# Patient Record
Sex: Female | Born: 1995 | Race: White | Hispanic: No | Marital: Single | Smoking: Never smoker
Health system: Southern US, Community
[De-identification: ages and names within clinical notes are randomized; demographics above are authoritative.]

## PROBLEM LIST (undated history)

## (undated) DIAGNOSIS — F909 Attention-deficit hyperactivity disorder, unspecified type: Secondary | ICD-10-CM

## (undated) DIAGNOSIS — F419 Anxiety disorder, unspecified: Secondary | ICD-10-CM

## (undated) DIAGNOSIS — F32A Depression, unspecified: Secondary | ICD-10-CM

## (undated) HISTORY — DX: Anxiety disorder, unspecified: F41.9

## (undated) HISTORY — PX: WISDOM TOOTH EXTRACTION: SHX21

## (undated) HISTORY — DX: Depression, unspecified: F32.A

## (undated) HISTORY — DX: Attention-deficit hyperactivity disorder, unspecified type: F90.9

---
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
## 2018-04-05 DIAGNOSIS — Z803 Family history of malignant neoplasm of breast: Secondary | ICD-10-CM | POA: Insufficient documentation

## 2018-04-05 DIAGNOSIS — Z8041 Family history of malignant neoplasm of ovary: Secondary | ICD-10-CM | POA: Insufficient documentation

## 2020-07-24 LAB — HM PAP SMEAR: HM Pap smear: ABNORMAL

## 2020-09-24 ENCOUNTER — Ambulatory Visit: Payer: Self-pay | Admitting: Family Medicine

## 2020-10-01 ENCOUNTER — Other Ambulatory Visit: Payer: Self-pay

## 2020-10-01 ENCOUNTER — Ambulatory Visit: Payer: BC Managed Care – PPO | Admitting: Family Medicine

## 2020-10-01 ENCOUNTER — Encounter: Payer: Self-pay | Admitting: Family Medicine

## 2020-10-01 VITALS — BP 110/73 | HR 81 | Temp 98.4°F | Ht 62.8 in | Wt 148.8 lb

## 2020-10-01 DIAGNOSIS — Z833 Family history of diabetes mellitus: Secondary | ICD-10-CM | POA: Diagnosis not present

## 2020-10-01 DIAGNOSIS — Z0000 Encounter for general adult medical examination without abnormal findings: Secondary | ICD-10-CM

## 2020-10-01 DIAGNOSIS — F988 Other specified behavioral and emotional disorders with onset usually occurring in childhood and adolescence: Secondary | ICD-10-CM | POA: Diagnosis not present

## 2020-10-01 LAB — URINALYSIS, ROUTINE W REFLEX MICROSCOPIC
Bilirubin, UA: NEGATIVE
Glucose, UA: NEGATIVE
Ketones, UA: NEGATIVE
Leukocytes,UA: NEGATIVE
Nitrite, UA: NEGATIVE
Protein,UA: NEGATIVE
Specific Gravity, UA: 1.015 (ref 1.005–1.030)
Urobilinogen, Ur: 0.2 mg/dL (ref 0.2–1.0)
pH, UA: 6.5 (ref 5.0–7.5)

## 2020-10-01 LAB — MICROSCOPIC EXAMINATION: WBC, UA: NONE SEEN /hpf (ref 0–5)

## 2020-10-01 LAB — BAYER DCA HB A1C WAIVED: HB A1C (BAYER DCA - WAIVED): 4.8 % (ref <7.0)

## 2020-10-01 NOTE — Assessment & Plan Note (Signed)
Diagnosed as an adult by counselor. Unsure if neuropsych testing- will get records from counselor. Interested in trying medication. Will get her back when she'd like to.

## 2020-10-01 NOTE — Progress Notes (Signed)
BP 110/73   Pulse 81   Temp 98.4 F (36.9 C) (Oral)   Ht 5' 2.8" (1.595 m)   Wt 148 lb 12.8 oz (67.5 kg)   LMP 09/28/2020   SpO2 100%   BMI 26.53 kg/m    Subjective:    Patient ID: Melody Pena, female    DOB: [birth year], 25 y.o.   MRN: [id number]  HPI: Melody Pena is a 25 y.o. female presenting on 10/01/2020 for comprehensive medical examination and to establish care. Current medical complaints include:  Has a history of of anxiety and depression- has been following with counselor. Wants to have labs checked. Has a family history of diabetes so she would like to have labs checked. She had acid reflux as a baby. She had drank a lot of alcohol 1 night and had a very sore throat. She had covid in January.  Menopausal Symptoms: no  Depression Screen done today and results listed below:  Depression screen [facility] 2/9 10/01/2020  Decreased Interest 1  Down, Depressed, Hopeless 0  PHQ - 2 Score 1  Altered sleeping 0  Tired, decreased energy 1  Change in appetite 0  Feeling bad or failure about yourself  1  Trouble concentrating 1  Moving slowly or fidgety/restless 0  Suicidal thoughts 0  PHQ-9 Score 4  Difficult doing work/chores Not difficult at all    Past Medical History:  Past Medical History:  Diagnosis Date   ADHD    Anxiety    Depression     Surgical History:  Past Surgical History:  Procedure Laterality Date   WISDOM TOOTH EXTRACTION      Medications:  No current outpatient medications on file prior to visit.   No current facility-administered medications on file prior to visit.    Allergies:  No Known Allergies  Social History:  Social History   Socioeconomic History   Marital status: Single    Spouse name: Not on file   Number of children: Not on file   Years of education: Not on file   Highest education level: Not on file  Occupational History   Not on file  Tobacco Use   Smoking status: Never   Smokeless tobacco: Never  Vaping Use    Vaping Use: Never used  Substance and Sexual Activity   Alcohol use: Yes    Comment: on occasion   Drug use: Never   Sexual activity: Yes  Other Topics Concern   Not on file  Social History Narrative   Not on file   Social Determinants of Health   Financial Resource Strain: Not on file  Food Insecurity: Not on file  Transportation Needs: Not on file  Physical Activity: Not on file  Stress: Not on file  Social Connections: Not on file  Intimate Partner Violence: Not on file   Social History   Tobacco Use  Smoking Status Never  Smokeless Tobacco Never   Social History   Substance and Sexual Activity  Alcohol Use Yes   Comment: on occasion    Family History:  Family History  Problem Relation Age of Onset   Arthritis Mother    Anxiety disorder Mother    Depression Mother    Diabetes Mother    ADD / ADHD Mother    Seizures Mother    Bipolar disorder Mother    Schizophrenia Mother    Bipolar disorder Father    Hypertension Father    Diabetes Father    Breast cancer Maternal Aunt  Leukemia Maternal Aunt    Ovarian cancer Maternal Grandmother    Diabetes Maternal Grandfather    Heart attack Paternal Grandfather     Past medical history, surgical history, medications, allergies, family history and social history reviewed with patient today and changes made to appropriate areas of the chart.   Review of Systems  Constitutional: Negative.   HENT:  Positive for sore throat. Negative for congestion, ear discharge, ear pain, hearing loss, nosebleeds, sinus pain and tinnitus.   Eyes: Negative.   Respiratory: Negative.  Negative for stridor.   Cardiovascular: Negative.   Gastrointestinal:  Positive for diarrhea and heartburn. Negative for abdominal pain, blood in stool, constipation, melena, nausea and vomiting.  Genitourinary:  Positive for frequency. Negative for dysuria, flank pain, hematuria and urgency.  Musculoskeletal:  Positive for back pain. Negative for  falls, joint pain, myalgias and neck pain.  Skin: Negative.   Neurological: Negative.   Endo/Heme/Allergies:  Negative for environmental allergies and polydipsia. Bruises/bleeds easily.  Psychiatric/Behavioral:  Positive for depression. Negative for hallucinations, memory loss, substance abuse and suicidal ideas. The patient is nervous/anxious. The patient does not have insomnia.   All other ROS negative except what is listed above and in the HPI.      Objective:    BP 110/73   Pulse 81   Temp 98.4 F (36.9 C) (Oral)   Ht 5' 2.8" (1.595 m)   Wt 148 lb 12.8 oz (67.5 kg)   LMP 09/28/2020   SpO2 100%   BMI 26.53 kg/m   Wt Readings from Last 3 Encounters:  10/01/20 148 lb 12.8 oz (67.5 kg)  No results found.  Physical Exam Vitals and nursing note reviewed.  Constitutional:      General: She is not in acute distress.    Appearance: Normal appearance. She is not ill-appearing, toxic-appearing or diaphoretic.  HENT:     Head: Normocephalic and atraumatic.     Right Ear: Tympanic membrane, ear canal and external ear normal. There is no impacted cerumen.     Left Ear: Tympanic membrane, ear canal and external ear normal. There is no impacted cerumen.     Nose: Nose normal. No congestion or rhinorrhea.     Mouth/Throat:     Mouth: Mucous membranes are moist.     Pharynx: Oropharynx is clear. No oropharyngeal exudate or posterior oropharyngeal erythema.  Eyes:     General: No scleral icterus.       Right eye: No discharge.        Left eye: No discharge.     Extraocular Movements: Extraocular movements intact.     Conjunctiva/sclera: Conjunctivae normal.     Pupils: Pupils are equal, round, and reactive to light.  Neck:     Vascular: No carotid bruit.  Cardiovascular:     Rate and Rhythm: Normal rate and regular rhythm.     Pulses: Normal pulses.     Heart sounds: No murmur heard.   No friction rub. No gallop.  Pulmonary:     Effort: Pulmonary effort is normal. No respiratory  distress.     Breath sounds: Normal breath sounds. No stridor. No wheezing, rhonchi or rales.  Chest:     Chest wall: No tenderness.  Abdominal:     General: Abdomen is flat. Bowel sounds are normal. There is no distension.     Palpations: Abdomen is soft. There is no mass.     Tenderness: There is no abdominal tenderness. There is no right CVA tenderness, left  CVA tenderness, guarding or rebound.     Hernia: No hernia is present.  Genitourinary:    Comments: Breast and pelvic exams deferred with shared decision making Musculoskeletal:        General: No swelling, tenderness, deformity or signs of injury.     Cervical back: Normal range of motion and neck supple. No rigidity. No muscular tenderness.     Right lower leg: No edema.     Left lower leg: No edema.  Lymphadenopathy:     Cervical: No cervical adenopathy.  Skin:    General: Skin is warm and dry.     Capillary Refill: Capillary refill takes less than 2 seconds.     Coloration: Skin is not jaundiced or pale.     Findings: No bruising, erythema, lesion or rash.  Neurological:     General: No focal deficit present.     Mental Status: She is alert and oriented to person, place, and time. Mental status is at baseline.     Cranial Nerves: No cranial nerve deficit.     Sensory: No sensory deficit.     Motor: No weakness.     Coordination: Coordination normal.     Gait: Gait normal.     Deep Tendon Reflexes: Reflexes normal.  Psychiatric:        Mood and Affect: Mood normal.        Behavior: Behavior normal.        Thought Content: Thought content normal.        Judgment: Judgment normal.    No results found for this or any previous visit.    Assessment & Plan:   Problem List Items Addressed This Visit   None    Follow up plan: No follow-ups on file.   LABORATORY TESTING:  - Pap smear: done elsewhere  IMMUNIZATIONS:   - Tdap: Tetanus vaccination status reviewed: unsure. - Influenza: Postponed to flu season -  Pneumovax: Not applicable - Prevnar: Not applicable - HPV: unsure - COVID vaccine: Up to date  PATIENT COUNSELING:   Advised to take 1 mg of folate supplement per day if capable of pregnancy.   Sexuality: Discussed sexually transmitted diseases, partner selection, use of condoms, avoidance of unintended pregnancy  and contraceptive alternatives.   Advised to avoid cigarette smoking.  I discussed with the patient that most people either abstain from alcohol or drink within safe limits (<=14/week and <=4 drinks/occasion for males, <=7/weeks and <= 3 drinks/occasion for females) and that the risk for alcohol disorders and other health effects rises proportionally with the number of drinks per week and how often a drinker exceeds daily limits.  Discussed cessation/primary prevention of drug use and availability of treatment for abuse.   Diet: Encouraged to adjust caloric intake to maintain  or achieve ideal body weight, to reduce intake of dietary saturated fat and total fat, to limit sodium intake by avoiding high sodium foods and not adding table salt, and to maintain adequate dietary potassium and calcium preferably from fresh fruits, vegetables, and low-fat dairy products.    stressed the importance of regular exercise  Injury prevention: Discussed safety belts, safety helmets, smoke detector, smoking near bedding or upholstery.   Dental health: Discussed importance of regular tooth brushing, flossing, and dental visits.    NEXT PREVENTATIVE PHYSICAL DUE IN 1 YEAR. No follow-ups on file.

## 2020-10-02 LAB — CBC WITH DIFFERENTIAL/PLATELET
Basophils Absolute: 0 10*3/uL (ref 0.0–0.2)
Basos: 1 %
EOS (ABSOLUTE): 0.1 10*3/uL (ref 0.0–0.4)
Eos: 1 %
Hematocrit: 42.9 % (ref 34.0–46.6)
Hemoglobin: 14 g/dL (ref 11.1–15.9)
Immature Grans (Abs): 0 10*3/uL (ref 0.0–0.1)
Immature Granulocytes: 0 %
Lymphocytes Absolute: 1.7 10*3/uL (ref 0.7–3.1)
Lymphs: 26 %
MCH: 30.2 pg (ref 26.6–33.0)
MCHC: 32.6 g/dL (ref 31.5–35.7)
MCV: 93 fL (ref 79–97)
Monocytes Absolute: 0.6 10*3/uL (ref 0.1–0.9)
Monocytes: 10 %
Neutrophils Absolute: 4 10*3/uL (ref 1.4–7.0)
Neutrophils: 62 %
Platelets: 199 10*3/uL (ref 150–450)
RBC: 4.64 x10E6/uL (ref 3.77–5.28)
RDW: 11.8 % (ref 11.7–15.4)
WBC: 6.4 10*3/uL (ref 3.4–10.8)

## 2020-10-02 LAB — COMPREHENSIVE METABOLIC PANEL
ALT: 21 IU/L (ref 0–32)
AST: 16 IU/L (ref 0–40)
Albumin/Globulin Ratio: 1.9 (ref 1.2–2.2)
Albumin: 4.6 g/dL (ref 3.9–5.0)
Alkaline Phosphatase: 60 IU/L (ref 44–121)
BUN/Creatinine Ratio: 10 (ref 9–23)
BUN: 8 mg/dL (ref 6–20)
Bilirubin Total: 0.4 mg/dL (ref 0.0–1.2)
CO2: 24 mmol/L (ref 20–29)
Calcium: 9.3 mg/dL (ref 8.7–10.2)
Chloride: 101 mmol/L (ref 96–106)
Creatinine, Ser: 0.77 mg/dL (ref 0.57–1.00)
Globulin, Total: 2.4 g/dL (ref 1.5–4.5)
Glucose: 81 mg/dL (ref 65–99)
Potassium: 4.3 mmol/L (ref 3.5–5.2)
Sodium: 139 mmol/L (ref 134–144)
Total Protein: 7 g/dL (ref 6.0–8.5)
eGFR: 110 mL/min/{1.73_m2} (ref >59)

## 2020-10-02 LAB — LIPID PANEL W/O CHOL/HDL RATIO
Cholesterol, Total: 137 mg/dL (ref 100–199)
HDL: 45 mg/dL (ref >39)
LDL Chol Calc (NIH): 71 mg/dL (ref 0–99)
Triglycerides: 118 mg/dL (ref 0–149)
VLDL Cholesterol Cal: 21 mg/dL (ref 5–40)

## 2020-10-02 LAB — VITAMIN D 25 HYDROXY (VIT D DEFICIENCY, FRACTURES): Vit D, 25-Hydroxy: 31.9 ng/mL (ref 30.0–100.0)

## 2020-10-02 LAB — TSH: TSH: 1.23 u[IU]/mL (ref 0.450–4.500)

## 2020-11-06 ENCOUNTER — Ambulatory Visit: Payer: BC Managed Care – PPO | Admitting: Family Medicine

## 2020-11-29 ENCOUNTER — Ambulatory Visit
Admission: RE | Admit: 2020-11-29 | Discharge: 2020-11-29 | Disposition: A | Discharge status: Home / Self Care | Payer: BC Managed Care – PPO | Source: Ambulatory Visit | Attending: Internal Medicine | Admitting: Internal Medicine

## 2020-11-29 ENCOUNTER — Other Ambulatory Visit: Payer: Self-pay

## 2020-11-29 VITALS — BP 105/66 | HR 78 | Temp 97.9°F | Resp 16

## 2020-11-29 DIAGNOSIS — Z20822 Contact with and (suspected) exposure to covid-19: Secondary | ICD-10-CM | POA: Diagnosis not present

## 2020-11-29 DIAGNOSIS — J029 Acute pharyngitis, unspecified: Secondary | ICD-10-CM | POA: Diagnosis present

## 2020-11-29 LAB — POCT RAPID STREP A (OFFICE): Rapid Strep A Screen: NEGATIVE

## 2020-11-29 MED ORDER — AMOXICILLIN 875 MG PO TABS: 875.0000 mg | ORAL_TABLET | Freq: Two times a day (BID) | ORAL | 0 refills | Status: AC

## 2020-11-29 NOTE — Discharge Instructions (Addendum)
Your rapid strep test is negative.  Throat culture and COVID-19 viral swab are pending.  You have been prescribed amoxicillin antibiotic due to suspicion of strep throat.  We will call if anything is positive.

## 2020-11-29 NOTE — ED Triage Notes (Signed)
Starting on 11/27/20, sore throat, 100.5 fever Friday morning, took fever medications, also took Ibuprofen, benadryl, temp was 97. something early Friday morning, fever broke, But at 12 pm Friday, chills came back, but went away about 4 pm Friday. Yesterday throat got worse. Today took Sudafed, drank tea, feels better now. Coughed up greenish mucus  this morning. Presently no fever and no cough but the throat is still hurting

## 2020-11-29 NOTE — ED Provider Notes (Signed)
[facility]    CSN: [id number] Arrival date & time: 11/29/20  1140      History   Chief Complaint Chief Complaint  Patient presents with   Sore Throat    HPI Melody Pena is a 25 y.o. female.   Patient presents with sore throat, fever, nasal congestion that has been present for approximately 2 to 3 days.  T-max at home was 100.5.  Has taken ibuprofen, Benadryl, over-the-counter decongestant with minimal improvement in symptoms.  Denies any known sick contacts.  Denies any chest pain or shortness of breath.   Sore Throat   Past Medical History:  Diagnosis Date   ADHD    Anxiety    Depression     Patient Active Problem List   Diagnosis Date Noted   Attention deficit disorder 10/01/2020   Family history of ovarian cancer 04/05/2018   Family history of breast cancer 04/05/2018    Past Surgical History:  Procedure Laterality Date   WISDOM TOOTH EXTRACTION      OB History   No obstetric history on file.      Home Medications    Prior to Admission medications   Medication Sig Start Date End Date Taking? Authorizing Provider  amoxicillin (AMOXIL) 875 MG tablet Take 1 tablet (875 mg total) by mouth 2 (two) times daily for 10 days. 11/29/20 12/09/20 Yes Lance Muss, FNP    Family History Family History  Problem Relation Age of Onset   Arthritis Mother    Anxiety disorder Mother    Depression Mother    Diabetes Mother    ADD / ADHD Mother    Seizures Mother    Bipolar disorder Mother    Schizophrenia Mother    Bipolar disorder Father    Hypertension Father    Diabetes Father    Breast cancer Maternal Aunt    Leukemia Maternal Aunt    Ovarian cancer Maternal Grandmother    Diabetes Maternal Grandfather    Heart attack Paternal Grandfather     Social History Social History   Tobacco Use   Smoking status: Never   Smokeless tobacco: Never  Vaping Use   Vaping Use: Never used  Substance Use Topics   Alcohol use: Yes     Comment: on occasion   Drug use: Never     Allergies   Patient has no known allergies.   Review of Systems Review of Systems Per HPI  Physical Exam Triage Vital Signs ED Triage Vitals  Enc Vitals Group     BP 11/29/20 1218 105/66     Pulse Rate 11/29/20 1218 78     Resp 11/29/20 1218 16     Temp 11/29/20 1218 97.9 F (36.6 C)     Temp Source 11/29/20 1218 Oral     SpO2 11/29/20 1218 98 %     Weight --      Height --      Head Circumference --      Peak Flow --      Pain Score 11/29/20 1220 4     Pain Loc --      Pain Edu? --      Excl. in GC? --    No data found.  Updated Vital Signs BP 105/66 (BP Location: Left Arm)   Pulse 78   Temp 97.9 F (36.6 C) (Oral)   Resp 16   LMP 11/28/2020   SpO2 98%   Visual Acuity Right Eye Distance:   Left Eye Distance:  Bilateral Distance:    Right Eye Near:   Left Eye Near:    Bilateral Near:     Physical Exam Constitutional:      General: She is not in acute distress.    Appearance: Normal appearance.  HENT:     Head: Normocephalic and atraumatic.     Right Ear: Tympanic membrane and ear canal normal.     Left Ear: Tympanic membrane and ear canal normal.     Nose: Congestion present.     Mouth/Throat:     Mouth: Mucous membranes are moist.     Pharynx: Posterior oropharyngeal erythema present.     Comments: White patches present to posterior pharynx. Eyes:     Extraocular Movements: Extraocular movements intact.     Conjunctiva/sclera: Conjunctivae normal.     Pupils: Pupils are equal, round, and reactive to light.  Cardiovascular:     Rate and Rhythm: Normal rate and regular rhythm.     Pulses: Normal pulses.     Heart sounds: Normal heart sounds.  Pulmonary:     Effort: Pulmonary effort is normal. No respiratory distress.     Breath sounds: Normal breath sounds. No wheezing.  Abdominal:     General: Abdomen is flat. Bowel sounds are normal.     Palpations: Abdomen is soft.  Musculoskeletal:         General: Normal range of motion.     Cervical back: Normal range of motion.  Skin:    General: Skin is warm and dry.  Neurological:     General: No focal deficit present.     Mental Status: She is alert and oriented to person, place, and time. Mental status is at baseline.  Psychiatric:        Mood and Affect: Mood normal.        Behavior: Behavior normal.     UC Treatments / Results  Labs (all labs ordered are listed, but only abnormal results are displayed) Labs Reviewed  CULTURE, GROUP A STREP ([facility])  NOVEL CORONAVIRUS, NAA  POCT RAPID STREP A (OFFICE)    EKG   Radiology No results found.  Procedures Procedures (including critical care time)  Medications Ordered in UC Medications - No data to display  Initial Impression / Assessment and Plan / UC Course  I have reviewed the triage vital signs and the nursing notes.  Pertinent labs & imaging results that were available during my care of the patient were reviewed by me and considered in my medical decision making (see chart for details).     Rapid strep test was negative but high suspicion for strep throat due to appearance of posterior pharynx on exam.  Will treat with amoxicillin x10 days.  Throat culture and COVID-19 viral swab are pending.  Fever monitoring and management discussed with patient.  Discussed strict return precautions. Patient verbalized understanding and is agreeable with plan.   Final Clinical Impressions(s) / UC Diagnoses   Final diagnoses:  Sore throat  Encounter for laboratory testing for COVID-19 virus     Discharge Instructions      Your rapid strep test is negative.  Throat culture and COVID-19 viral swab are pending.  You have been prescribed amoxicillin antibiotic due to suspicion of strep throat.  We will call if anything is positive.     ED Prescriptions     Medication Sig Dispense Auth. Provider   amoxicillin (AMOXIL) 875 MG tablet Take 1 tablet (875 mg total) by mouth 2  (two) times  daily for 10 days. 20 tablet Lance Muss, FNP      PDMP not reviewed this encounter.   Lance Muss, FNP 11/29/20 [phone]

## 2020-12-01 LAB — CULTURE, GROUP A STREP (THRC)

## 2020-12-01 LAB — NOVEL CORONAVIRUS, NAA: SARS-CoV-2, NAA: NOT DETECTED

## 2020-12-01 LAB — SARS-COV-2, NAA 2 DAY TAT

## 2020-12-15 ENCOUNTER — Encounter: Payer: Self-pay | Admitting: Nurse Practitioner

## 2020-12-15 ENCOUNTER — Ambulatory Visit: Payer: BC Managed Care – PPO | Admitting: Nurse Practitioner

## 2020-12-15 ENCOUNTER — Other Ambulatory Visit: Payer: Self-pay

## 2020-12-15 VITALS — BP 114/77 | HR 94 | Temp 98.6°F | Ht 62.0 in | Wt 145.0 lb

## 2020-12-15 DIAGNOSIS — J019 Acute sinusitis, unspecified: Secondary | ICD-10-CM

## 2020-12-15 MED ORDER — DOXYCYCLINE HYCLATE 100 MG PO TABS: 100.0000 mg | ORAL_TABLET | Freq: Two times a day (BID) | ORAL | 0 refills | Status: DC

## 2020-12-15 MED ORDER — PREDNISONE 10 MG PO TABS: ORAL_TABLET | ORAL | 0 refills | Status: DC

## 2020-12-15 NOTE — Patient Instructions (Signed)
Start flonase (fluticasone) daily, you can get this at any pharmacy Start antibiotic and prednisone sent to your pharmacy Follow up if your not any better after completing the course of medications.

## 2020-12-15 NOTE — Progress Notes (Signed)
Acute Office Visit  Subjective:    Patient ID: Melody Pena, female    DOB: Aug 07, 1995, 25 y.o.   MRN: [id number]  Chief Complaint  Patient presents with   URI    HPI Patient is in today for URI symptoms. September 22 had 102.5 fever, and sore throat. Next day, endorsed chills and sore throat. Went to urgent care on 11/30/20 and was negative for strep and covid-19. Treated for strep with amoxicillin, as she had white spots on her tonsils. Her symptoms had improved some, however her sore throat and sinus pressure have worsened again since finishing the antibiotic.   UPPER RESPIRATORY TRACT INFECTION  Worst symptom: sinus pressure Fever: no Cough: yes Shortness of breath: no Wheezing: no Chest pain: no Chest tightness: no Chest congestion: yes Nasal congestion: yes Runny nose: yes Post nasal drip: yes Sneezing: yes Sore throat: yes Swollen glands: no Sinus pressure: yes Headache: yes Face pain: no Toothache: no Ear pain: no  Ear pressure: no  Eyes red/itching:no Eye drainage/crusting: no  Vomiting: no Rash: no Fatigue: yes Sick contacts: no Strep contacts: no  Context: fluctuating Recurrent sinusitis: no Relief with OTC cold/cough medications: yes  Treatments attempted: cold/sinus and anti-histamine, ibuprofen, cough drop, amoxicillin    Past Medical History:  Diagnosis Date   ADHD    Anxiety    Depression     Past Surgical History:  Procedure Laterality Date   WISDOM TOOTH EXTRACTION      Family History  Problem Relation Age of Onset   Arthritis Mother    Anxiety disorder Mother    Depression Mother    Diabetes Mother    ADD / ADHD Mother    Seizures Mother    Bipolar disorder Mother    Schizophrenia Mother    Bipolar disorder Father    Hypertension Father    Diabetes Father    Breast cancer Maternal Aunt    Leukemia Maternal Aunt    Ovarian cancer Maternal Grandmother    Diabetes Maternal Grandfather    Heart attack Paternal  Grandfather     Social History   Socioeconomic History   Marital status: Single    Spouse name: Not on file   Number of children: Not on file   Years of education: Not on file   Highest education level: Not on file  Occupational History   Not on file  Tobacco Use   Smoking status: Never   Smokeless tobacco: Never  Vaping Use   Vaping Use: Never used  Substance and Sexual Activity   Alcohol use: Yes    Comment: on occasion   Drug use: Never   Sexual activity: Yes  Other Topics Concern   Not on file  Social History Narrative   Not on file   Social Determinants of Health   Financial Resource Strain: Not on file  Food Insecurity: Not on file  Transportation Needs: Not on file  Physical Activity: Not on file  Stress: Not on file  Social Connections: Not on file  Intimate Partner Violence: Not on file    Outpatient Medications Prior to Visit  Medication Sig Dispense Refill   pseudoephedrine-acetaminophen (TYLENOL SINUS) 30-500 MG TABS tablet Take 1 tablet by mouth every 4 (four) hours as needed.     No facility-administered medications prior to visit.    No Known Allergies  Review of Systems  Constitutional:  Positive for fatigue. Negative for fever.  HENT:  Positive for congestion, postnasal drip, rhinorrhea, sinus pressure and sore  throat. Negative for ear pain.   Eyes: Negative.   Respiratory:  Positive for cough. Negative for shortness of breath.   Cardiovascular: Negative.   Gastrointestinal: Negative.   Genitourinary: Negative.   Musculoskeletal: Negative.   Skin: Negative.   Neurological:  Positive for headaches. Negative for dizziness.      Objective:    Physical Exam Vitals and nursing note reviewed.  Constitutional:      General: She is not in acute distress.    Appearance: Normal appearance.  HENT:     Head: Normocephalic.     Right Ear: Tympanic membrane, ear canal and external ear normal.     Left Ear: Tympanic membrane, ear canal and  external ear normal.     Nose: Nose normal.     Mouth/Throat:     Mouth: Mucous membranes are moist.     Pharynx: Posterior oropharyngeal erythema present. No oropharyngeal exudate.  Eyes:     Conjunctiva/sclera: Conjunctivae normal.  Cardiovascular:     Rate and Rhythm: Normal rate and regular rhythm.     Pulses: Normal pulses.     Heart sounds: Normal heart sounds.  Pulmonary:     Effort: Pulmonary effort is normal.     Breath sounds: Normal breath sounds.  Abdominal:     Palpations: Abdomen is soft.     Tenderness: There is no abdominal tenderness.  Musculoskeletal:     Cervical back: Normal range of motion and neck supple. Tenderness present.  Lymphadenopathy:     Cervical: No cervical adenopathy.  Skin:    General: Skin is warm.  Neurological:     General: No focal deficit present.     Mental Status: She is alert and oriented to person, place, and time.  Psychiatric:        Mood and Affect: Mood normal.        Behavior: Behavior normal.        Thought Content: Thought content normal.        Judgment: Judgment normal.    BP 114/77   Pulse 94   Temp 98.6 F (37 C)   Ht 5' 2"  (1.575 m)   Wt 145 lb (65.8 kg)   LMP 11/28/2020   SpO2 99%   BMI 26.52 kg/m  Wt Readings from Last 3 Encounters:  12/15/20 145 lb (65.8 kg)  10/01/20 148 lb 12.8 oz (67.5 kg)    Health Maintenance Due  Topic Date Due   HPV VACCINES (2 - 3-dose series) 02/08/2012   PAP-Cervical Cytology Screening  Never done   PAP SMEAR-Modifier  Never done   TETANUS/TDAP  10/29/2017   INFLUENZA VACCINE  Never done       Topic Date Due   HPV VACCINES (2 - 3-dose series) 02/08/2012     Lab Results  Component Value Date   TSH 1.230 10/01/2020   Lab Results  Component Value Date   WBC 6.4 10/01/2020   HGB 14.0 10/01/2020   HCT 42.9 10/01/2020   MCV 93 10/01/2020   PLT 199 10/01/2020   Lab Results  Component Value Date   NA 139 10/01/2020   K 4.3 10/01/2020   CO2 24 10/01/2020    GLUCOSE 81 10/01/2020   BUN 8 10/01/2020   CREATININE 0.77 10/01/2020   BILITOT 0.4 10/01/2020   ALKPHOS 60 10/01/2020   AST 16 10/01/2020   ALT 21 10/01/2020   PROT 7.0 10/01/2020   ALBUMIN 4.6 10/01/2020   CALCIUM 9.3 10/01/2020   EGFR 110 10/01/2020  Lab Results  Component Value Date   CHOL 137 10/01/2020   Lab Results  Component Value Date   HDL 45 10/01/2020   Lab Results  Component Value Date   LDLCALC 71 10/01/2020   Lab Results  Component Value Date   TRIG 118 10/01/2020   No results found for: [facility] Lab Results  Component Value Date   HGBA1C 4.8 10/01/2020       Assessment & Plan:   Problem List Items Addressed This Visit   None Visit Diagnoses     Acute non-recurrent sinusitis, unspecified location    -  Primary   Will treat with prednisone taper and doxycycline. Can use OTC flonase, continue nasal saline spray, and cold/sinus med. F/U if no improvement or worsen.   Relevant Medications   pseudoephedrine-acetaminophen (TYLENOL SINUS) 30-500 MG TABS tablet   doxycycline (VIBRA-TABS) 100 MG tablet   predniSONE (DELTASONE) 10 MG tablet        Meds ordered this encounter  Medications   doxycycline (VIBRA-TABS) 100 MG tablet    Sig: Take 1 tablet (100 mg total) by mouth 2 (two) times daily.    Dispense:  20 tablet    Refill:  0   predniSONE (DELTASONE) 10 MG tablet    Sig: Take 6 tablets today, then 5 tablets tomorrow, then decrease by 1 tablet every day until gone    Dispense:  21 tablet    Refill:  0      Charyl Dancer, NP

## 2020-12-25 ENCOUNTER — Encounter: Payer: Self-pay | Admitting: Nurse Practitioner

## 2020-12-25 ENCOUNTER — Other Ambulatory Visit: Payer: Self-pay

## 2020-12-25 ENCOUNTER — Ambulatory Visit (INDEPENDENT_AMBULATORY_CARE_PROVIDER_SITE_OTHER): Payer: BC Managed Care – PPO | Admitting: Nurse Practitioner

## 2020-12-25 ENCOUNTER — Ambulatory Visit
Admission: RE | Admit: 2020-12-25 | Discharge: 2020-12-25 | Disposition: A | Discharge status: Home / Self Care | Payer: BC Managed Care – PPO | Attending: Nurse Practitioner | Admitting: Nurse Practitioner

## 2020-12-25 ENCOUNTER — Ambulatory Visit
Admission: RE | Admit: 2020-12-25 | Discharge: 2020-12-25 | Disposition: A | Discharge status: Home / Self Care | Payer: BC Managed Care – PPO | Source: Ambulatory Visit | Attending: Nurse Practitioner | Admitting: Nurse Practitioner

## 2020-12-25 VITALS — BP 107/65 | HR 96 | Temp 98.2°F | Ht 63.0 in | Wt 143.1 lb

## 2020-12-25 DIAGNOSIS — R051 Acute cough: Secondary | ICD-10-CM

## 2020-12-25 MED ORDER — BENZONATATE 100 MG PO CAPS: 100.0000 mg | ORAL_CAPSULE | Freq: Three times a day (TID) | ORAL | 0 refills | Status: DC | PRN

## 2020-12-25 NOTE — Patient Instructions (Addendum)
[facility] [address] [place], [place] [address] [phone]   Start claritin (loratadine) or zyrtec (cetirizine) daily Continue flonase daily Make sure you are drinking plenty of fluids You can take tessalon perles 1-2 capsules as needed (up to 3 times a day)

## 2020-12-25 NOTE — Progress Notes (Signed)
Acute Office Visit  Subjective:    Patient ID: Melody Pena, female    DOB: 1995-07-12, 25 y.o.   MRN: [id number]  Chief Complaint  Patient presents with   Cough    Still congested. Productive cough with yellow mucous. 3 weeks for symptoms. Did a rapid and PCR covid test 3 weeks ago- Negative.     HPI Patient is in today for congestion and productive cough. She was seen in urgent care on 11/29/20 and had a negative covid-19 and strep, she was treated with amoxicillin. She had started to feel better, then got worse again. She was seen on 12/15/20 and treated with prednisone and doxycycline. Her symptoms improved with the antibiotic and prednisone, however worsened again.   UPPER RESPIRATORY TRACT INFECTION  Worst symptom: cough, mucus Fever: no Cough: yes Shortness of breath: no Wheezing: no Chest pain: no Chest tightness: no Chest congestion: no Nasal congestion: no Runny nose: no Post nasal drip: yes Sneezing: no Sore throat: yes - dry Swollen glands: yes Sinus pressure: no Headache: no Face pain: no Toothache: no Ear pain: no  Ear pressure: no  Eyes red/itching:no Eye drainage/crusting: no  Vomiting: no Rash: no Fatigue: yes Sick contacts: no Strep contacts: no  Context: fluctuating Recurrent sinusitis: no Relief with OTC cold/cough medications: yes  Treatments attempted:  tylenol severe sinus, flonase     Past Medical History:  Diagnosis Date   ADHD    Anxiety    Depression     Past Surgical History:  Procedure Laterality Date   WISDOM TOOTH EXTRACTION      Family History  Problem Relation Age of Onset   Arthritis Mother    Anxiety disorder Mother    Depression Mother    Diabetes Mother    ADD / ADHD Mother    Seizures Mother    Bipolar disorder Mother    Schizophrenia Mother    Bipolar disorder Father    Hypertension Father    Diabetes Father    Breast cancer Maternal Aunt    Leukemia Maternal Aunt    Ovarian cancer Maternal  Grandmother    Diabetes Maternal Grandfather    Heart attack Paternal Grandfather     Social History   Socioeconomic History   Marital status: Single    Spouse name: Not on file   Number of children: Not on file   Years of education: Not on file   Highest education level: Not on file  Occupational History   Not on file  Tobacco Use   Smoking status: Never   Smokeless tobacco: Never  Vaping Use   Vaping Use: Never used  Substance and Sexual Activity   Alcohol use: Yes    Comment: on occasion   Drug use: Never   Sexual activity: Yes  Other Topics Concern   Not on file  Social History Narrative   Not on file   Social Determinants of Health   Financial Resource Strain: Not on file  Food Insecurity: Not on file  Transportation Needs: Not on file  Physical Activity: Not on file  Stress: Not on file  Social Connections: Not on file  Intimate Partner Violence: Not on file    Outpatient Medications Prior to Visit  Medication Sig Dispense Refill   pseudoephedrine-acetaminophen (TYLENOL SINUS) 30-500 MG TABS tablet Take 1 tablet by mouth every 4 (four) hours as needed.     doxycycline (VIBRA-TABS) 100 MG tablet Take 1 tablet (100 mg total) by mouth 2 (two) times daily.  20 tablet 0   predniSONE (DELTASONE) 10 MG tablet Take 6 tablets today, then 5 tablets tomorrow, then decrease by 1 tablet every day until gone 21 tablet 0   No facility-administered medications prior to visit.    No Known Allergies  Review of Systems  Constitutional:  Positive for fatigue. Negative for fever.  HENT:  Positive for postnasal drip and sore throat. Negative for congestion, ear pain, rhinorrhea, sinus pressure and sinus pain.   Eyes: Negative.   Respiratory:  Positive for cough. Negative for shortness of breath.   Cardiovascular: Negative.   Gastrointestinal: Negative.   Genitourinary: Negative.   Musculoskeletal: Negative.   Skin: Negative.   Neurological: Negative.       Objective:     Physical Exam Vitals and nursing note reviewed.  Constitutional:      General: She is not in acute distress.    Appearance: Normal appearance.  HENT:     Head: Normocephalic.     Right Ear: Tympanic membrane, ear canal and external ear normal.     Left Ear: Tympanic membrane, ear canal and external ear normal.     Mouth/Throat:     Pharynx: Posterior oropharyngeal erythema present. No oropharyngeal exudate.  Eyes:     Conjunctiva/sclera: Conjunctivae normal.  Cardiovascular:     Rate and Rhythm: Normal rate and regular rhythm.     Pulses: Normal pulses.     Heart sounds: Normal heart sounds.  Pulmonary:     Effort: Pulmonary effort is normal.     Breath sounds: Normal breath sounds.  Musculoskeletal:     Cervical back: Normal range of motion.  Skin:    General: Skin is warm.  Neurological:     General: No focal deficit present.     Mental Status: She is alert and oriented to person, place, and time.  Psychiatric:        Mood and Affect: Mood normal.        Behavior: Behavior normal.        Thought Content: Thought content normal.        Judgment: Judgment normal.    BP 107/65   Pulse 96   Temp 98.2 F (36.8 C) (Oral)   Ht 5' 3"  (1.6 m)   Wt 143 lb 0.8 oz (64.9 kg)   LMP 11/28/2020   SpO2 100%   BMI 25.34 kg/m  Wt Readings from Last 3 Encounters:  12/25/20 143 lb 0.8 oz (64.9 kg)  12/15/20 145 lb (65.8 kg)  10/01/20 148 lb 12.8 oz (67.5 kg)    Health Maintenance Due  Topic Date Due   HPV VACCINES (2 - Risk 3-dose series) 02/08/2012   TETANUS/TDAP  10/29/2017   COVID-19 Vaccine (4 - Booster for Pfizer series) 05/17/2020   INFLUENZA VACCINE  Never done       Topic Date Due   HPV VACCINES (2 - Risk 3-dose series) 02/08/2012     Lab Results  Component Value Date   TSH 1.230 10/01/2020   Lab Results  Component Value Date   WBC 6.4 10/01/2020   HGB 14.0 10/01/2020   HCT 42.9 10/01/2020   MCV 93 10/01/2020   PLT 199 10/01/2020   Lab Results   Component Value Date   NA 139 10/01/2020   K 4.3 10/01/2020   CO2 24 10/01/2020   GLUCOSE 81 10/01/2020   BUN 8 10/01/2020   CREATININE 0.77 10/01/2020   BILITOT 0.4 10/01/2020   ALKPHOS 60 10/01/2020   AST 16  10/01/2020   ALT 21 10/01/2020   PROT 7.0 10/01/2020   ALBUMIN 4.6 10/01/2020   CALCIUM 9.3 10/01/2020   EGFR 110 10/01/2020   Lab Results  Component Value Date   CHOL 137 10/01/2020   Lab Results  Component Value Date   HDL 45 10/01/2020   Lab Results  Component Value Date   LDLCALC 71 10/01/2020   Lab Results  Component Value Date   TRIG 118 10/01/2020   No results found for: [facility] Lab Results  Component Value Date   HGBA1C 4.8 10/01/2020       Assessment & Plan:   Problem List Items Addressed This Visit   None Visit Diagnoses     Acute cough    -  Primary   Ongoing x3 weeks. Had 2 rounds abx. With productive mucus, will check cxr. Continue flonase, start zyrtec or claritin daily. Tessalon prn. F/U if no improvement   Relevant Orders   DG Chest 2 View        Meds ordered this encounter  Medications   benzonatate (TESSALON) 100 MG capsule    Sig: Take 1-2 capsules (100-200 mg total) by mouth 3 (three) times daily as needed for cough.    Dispense:  30 capsule    Refill:  0      Charyl Dancer, NP

## 2021-07-07 ENCOUNTER — Ambulatory Visit: Payer: BC Managed Care – PPO | Admitting: Family Medicine

## 2021-07-07 ENCOUNTER — Encounter: Payer: Self-pay | Admitting: Family Medicine

## 2021-07-07 VITALS — BP 105/70 | HR 83 | Temp 98.2°F | Wt 144.8 lb

## 2021-07-07 DIAGNOSIS — J069 Acute upper respiratory infection, unspecified: Secondary | ICD-10-CM | POA: Diagnosis not present

## 2021-07-07 DIAGNOSIS — J029 Acute pharyngitis, unspecified: Secondary | ICD-10-CM

## 2021-07-07 MED ORDER — FLUTICASONE PROPIONATE 50 MCG/ACT NA SUSP: 2.0000 | Freq: Every day | NASAL | 6 refills | Status: DC

## 2021-07-07 MED ORDER — PREDNISONE 50 MG PO TABS: 50.0000 mg | ORAL_TABLET | Freq: Every day | ORAL | 0 refills | Status: DC

## 2021-07-07 MED ORDER — CETIRIZINE HCL 10 MG PO TABS: 10.0000 mg | ORAL_TABLET | Freq: Every day | ORAL | 11 refills | Status: DC

## 2021-07-07 NOTE — Progress Notes (Signed)
? ?BP 105/70   Pulse 83   Temp 98.2 ?F (36.8 ?C)   Wt 144 lb 12.8 oz (65.7 kg)   SpO2 100%   BMI 25.65 kg/m?   ? ?Subjective:  ? ? Patient ID: Melody Pena, female    DOB: 1995/07/03, 26 y.o.   MRN: [id number] ? ?HPI: ?Melody Pena is a 26 y.o. female ? ?Chief Complaint  ?Patient presents with  ? URI  ?  Patient states she had ST last week, fever and chills over the weekend. Patient states she started having nausea and congestion, and can not smell or taste. Patient states she took a COVID test yesterday that was negative.   ? ?UPPER RESPIRATORY TRACT INFECTION ?Duration: about 4-5 days ?Worst symptom: sore throat ?Fever: yes ?Cough: no ?Shortness of breath: no ?Wheezing: no ?Chest pain: no ?Chest tightness: no ?Chest congestion: no ?Nasal congestion: yes ?Runny nose: no ?Post nasal drip: yes ?Sneezing: no ?Sore throat: yes ?Swollen glands: no ?Sinus pressure: no ?Headache: no ?Face pain: no ?Toothache: no ?Ear pain: yes "right ?Ear pressure: no  ?Eyes red/itching:no ?Eye drainage/crusting: no  ?Vomiting: no ?Rash: no ?Fatigue: yes ?Sick contacts: no ?Strep contacts: no  ?Context: worse ?Recurrent sinusitis: no ?Relief with OTC cold/cough medications: no  ?Treatments attempted: mucinex  ? ?Relevant past medical, surgical, family and social history reviewed and updated as indicated. Interim medical history since our last visit reviewed. ?Allergies and medications reviewed and updated. ? ?Review of Systems  ?Constitutional:  Positive for fatigue and fever. Negative for activity change, appetite change, chills, diaphoresis and unexpected weight change.  ?HENT:  Positive for congestion, ear pain, postnasal drip, rhinorrhea and sore throat. Negative for dental problem, drooling, ear discharge, facial swelling, hearing loss, mouth sores, nosebleeds, sinus pressure, sinus pain, sneezing, tinnitus, trouble swallowing and voice change.   ?Eyes: Negative.   ?Respiratory:  Positive for cough and shortness of breath.  Negative for apnea, choking, chest tightness, wheezing and stridor.   ?Cardiovascular: Negative.   ?Gastrointestinal: Negative.   ?Psychiatric/Behavioral: Negative.    ? ?Per HPI unless specifically indicated above ? ?   ?Objective:  ?  ?BP 105/70   Pulse 83   Temp 98.2 ?F (36.8 ?C)   Wt 144 lb 12.8 oz (65.7 kg)   SpO2 100%   BMI 25.65 kg/m?   ?Wt Readings from Last 3 Encounters:  ?07/07/21 144 lb 12.8 oz (65.7 kg)  ?12/25/20 143 lb 0.8 oz (64.9 kg)  ?12/15/20 145 lb (65.8 kg)  ?  ?Physical Exam ?Vitals and nursing note reviewed.  ?Constitutional:   ?   General: She is not in acute distress. ?   Appearance: Normal appearance. She is normal weight. She is not ill-appearing, toxic-appearing or diaphoretic.  ?HENT:  ?   Head: Normocephalic and atraumatic.  ?   Right Ear: Tympanic membrane, ear canal and external ear normal.  ?   Left Ear: Tympanic membrane and external ear normal.  ?   Nose: Congestion and rhinorrhea present.  ?   Mouth/Throat:  ?   Mouth: Mucous membranes are moist.  ?   Pharynx: Oropharynx is clear. No oropharyngeal exudate or posterior oropharyngeal erythema.  ?Eyes:  ?   General: No scleral icterus.    ?   Right eye: No discharge.     ?   Left eye: No discharge.  ?   Extraocular Movements: Extraocular movements intact.  ?   Conjunctiva/sclera: Conjunctivae normal.  ?   Pupils: Pupils are equal, round, and reactive  to light.  ?Cardiovascular:  ?   Rate and Rhythm: Normal rate and regular rhythm.  ?   Pulses: Normal pulses.  ?   Heart sounds: Normal heart sounds. No murmur heard. ?  No friction rub. No gallop.  ?Pulmonary:  ?   Effort: Pulmonary effort is normal. No respiratory distress.  ?   Breath sounds: Normal breath sounds. No stridor. No wheezing, rhonchi or rales.  ?Chest:  ?   Chest wall: No tenderness.  ?Musculoskeletal:     ?   General: Normal range of motion.  ?   Cervical back: Normal range of motion and neck supple.  ?Skin: ?   General: Skin is warm and dry.  ?   Capillary Refill:  Capillary refill takes less than 2 seconds.  ?   Coloration: Skin is not jaundiced or pale.  ?   Findings: No bruising, erythema, lesion or rash.  ?Neurological:  ?   General: No focal deficit present.  ?   Mental Status: She is alert and oriented to person, place, and time. Mental status is at baseline.  ?Psychiatric:     ?   Mood and Affect: Mood normal.     ?   Behavior: Behavior normal.     ?   Thought Content: Thought content normal.     ?   Judgment: Judgment normal.  ? ? ?Results for orders placed or performed in visit on 07/07/21  ?Rapid Strep screen([facility])  ? Specimen: Other  ? Other  ?Result Value Ref Range  ? Strep Gp A Ag, IA W/Reflex Negative Negative  ?Culture, Group A Strep  ? Other  ?Result Value Ref Range  ? Strep A Culture WILL FOLLOW   ? ?   ?Assessment & Plan:  ? ?Problem List Items Addressed This Visit   ?None ?Visit Diagnoses   ? ? Upper respiratory tract infection, unspecified type    -  Primary  ? Will treat with prednisone and allergy medicine. Call if not getting better or getting worse.   ? Sore throat      ? Strep negative.   ? Relevant Orders  ? Novel Coronavirus, NAA ([facility])  ? Rapid Strep screen([facility]) (Completed)  ? ?  ?  ? ?Follow up plan: ?Return July for physical. ? ? ? ? ? ?

## 2021-07-09 LAB — NOVEL CORONAVIRUS, NAA: SARS-CoV-2, NAA: NOT DETECTED

## 2021-07-10 LAB — CULTURE, GROUP A STREP: Strep A Culture: NEGATIVE

## 2021-07-10 LAB — RAPID STREP SCREEN (MED CTR MEBANE ONLY): Strep Gp A Ag, IA W/Reflex: NEGATIVE

## 2021-09-10 ENCOUNTER — Encounter: Payer: BC Managed Care – PPO | Admitting: Family Medicine

## 2021-10-08 ENCOUNTER — Encounter: Payer: BC Managed Care – PPO | Admitting: Family Medicine

## 2021-10-15 ENCOUNTER — Ambulatory Visit (INDEPENDENT_AMBULATORY_CARE_PROVIDER_SITE_OTHER): Payer: BC Managed Care – PPO | Admitting: Family Medicine

## 2021-10-15 ENCOUNTER — Encounter: Payer: Self-pay | Admitting: Family Medicine

## 2021-10-15 VITALS — BP 107/69 | HR 81 | Temp 98.1°F | Ht 63.0 in | Wt 150.0 lb

## 2021-10-15 DIAGNOSIS — Z23 Encounter for immunization: Secondary | ICD-10-CM

## 2021-10-15 DIAGNOSIS — N926 Irregular menstruation, unspecified: Secondary | ICD-10-CM | POA: Diagnosis not present

## 2021-10-15 DIAGNOSIS — Z0000 Encounter for general adult medical examination without abnormal findings: Secondary | ICD-10-CM | POA: Diagnosis not present

## 2021-10-15 LAB — URINALYSIS, ROUTINE W REFLEX MICROSCOPIC
Bilirubin, UA: NEGATIVE
Glucose, UA: NEGATIVE
Ketones, UA: NEGATIVE
Leukocytes,UA: NEGATIVE
Nitrite, UA: NEGATIVE
Protein,UA: NEGATIVE
RBC, UA: NEGATIVE
Specific Gravity, UA: 1.02 (ref 1.005–1.030)
Urobilinogen, Ur: 0.2 mg/dL (ref 0.2–1.0)
pH, UA: 7 (ref 5.0–7.5)

## 2021-10-15 NOTE — Progress Notes (Signed)
BP 107/69   Pulse 81   Temp 98.1 F (36.7 C)   Ht 5\' 3"  (1.6 m)   Wt 150 lb (68 kg)   SpO2 100%   BMI 26.57 kg/m    Subjective:    Patient ID: , female    DOB: 07-05-1995, 26 y.o.   MRN: [id number]  HPI: Melody Pena is a 26 y.o. female presenting on 10/15/2021 for comprehensive medical examination. Current medical complaints include: Has been having hormonal acne, and mood changes as well as very sore breasts before her period every month.   Menopausal Symptoms: no  Depression Screen done today and results listed below:     10/15/2021    2:34 PM 12/25/2020    1:31 PM 10/01/2020    9:18 AM  Depression screen PHQ 2/9  Decreased Interest 1 1 1   Down, Depressed, Hopeless 1 1 0  PHQ - 2 Score 2 2 1   Altered sleeping 2 0 0  Tired, decreased energy 1 2 1   Change in appetite 1 0 0  Feeling bad or failure about yourself  0 0 1  Trouble concentrating 2 1 1   Moving slowly or fidgety/restless 0 0 0  Suicidal thoughts 0 0 0  PHQ-9 Score 8 5 4   Difficult doing work/chores Somewhat difficult  Not difficult at all    Past Medical History:  Past Medical History:  Diagnosis Date   ADHD    Anxiety    Depression     Surgical History:  Past Surgical History:  Procedure Laterality Date   WISDOM TOOTH EXTRACTION      Medications:  Current Outpatient Medications on File Prior to Visit  Medication Sig   cetirizine (ZYRTEC) 10 MG tablet Take 1 tablet (10 mg total) by mouth daily.   fluticasone (FLONASE) 50 MCG/ACT nasal spray Place 2 sprays into both nostrils daily.   No current facility-administered medications on file prior to visit.    Allergies:  No Known Allergies  Social History:  Social History   Socioeconomic History   Marital status: Single    Spouse name: Not on file   Number of children: Not on file   Years of education: Not on file   Highest education level: Not on file  Occupational History   Not on file  Tobacco Use   Smoking  status: Never   Smokeless tobacco: Never  Vaping Use   Vaping Use: Never used  Substance and Sexual Activity   Alcohol use: Yes    Comment: on occasion   Drug use: Never   Sexual activity: Yes  Other Topics Concern   Not on file  Social History Narrative   Not on file   Social Determinants of Health   Financial Resource Strain: Not on file  Food Insecurity: Not on file  Transportation Needs: Not on file  Physical Activity: Not on file  Stress: Not on file  Social Connections: Not on file  Intimate Partner Violence: Not on file   Social History   Tobacco Use  Smoking Status Never  Smokeless Tobacco Never   Social History   Substance and Sexual Activity  Alcohol Use Yes   Comment: on occasion    Family History:  Family History  Problem Relation Age of Onset   Arthritis Mother    Anxiety disorder Mother    Depression Mother    Diabetes Mother    ADD / ADHD Mother    Seizures Mother    Bipolar disorder Mother  Schizophrenia Mother    Bipolar disorder Father    Hypertension Father    Diabetes Father    Breast cancer Maternal Aunt    Leukemia Maternal Aunt    Ovarian cancer Maternal Grandmother    Diabetes Maternal Grandfather    Heart attack Paternal Grandfather     Past medical history, surgical history, medications, allergies, family history and social history reviewed with patient today and changes made to appropriate areas of the chart.   Review of Systems  Constitutional: Negative.   HENT: Negative.         Clogged ear on the R  Eyes: Negative.   Respiratory: Negative.    Cardiovascular: Negative.   Gastrointestinal:  Positive for blood in stool. Negative for abdominal pain, constipation, diarrhea, heartburn, melena, nausea and vomiting.  Genitourinary:  Positive for dysuria and flank pain. Negative for frequency, hematuria and urgency.  Musculoskeletal:  Positive for back pain. Negative for falls, joint pain, myalgias and neck pain.  Skin:  Negative.   Neurological: Negative.   Endo/Heme/Allergies:  Positive for environmental allergies. Negative for polydipsia. Does not bruise/bleed easily.  Psychiatric/Behavioral: Negative.     All other ROS negative except what is listed above and in the HPI.      Objective:    BP 107/69   Pulse 81   Temp 98.1 F (36.7 C)   Ht 5\' 3"  (1.6 m)   Wt 150 lb (68 kg)   SpO2 100%   BMI 26.57 kg/m   Wt Readings from Last 3 Encounters:  10/15/21 150 lb (68 kg)  07/07/21 144 lb 12.8 oz (65.7 kg)  12/25/20 143 lb 0.8 oz (64.9 kg)    Physical Exam Vitals and nursing note reviewed.  Constitutional:      General: She is not in acute distress.    Appearance: Normal appearance. She is normal weight. She is not ill-appearing, toxic-appearing or diaphoretic.  HENT:     Head: Normocephalic and atraumatic.     Right Ear: Tympanic membrane, ear canal and external ear normal. There is no impacted cerumen.     Left Ear: Tympanic membrane, ear canal and external ear normal. There is no impacted cerumen.     Nose: Nose normal. No congestion or rhinorrhea.     Mouth/Throat:     Mouth: Mucous membranes are moist.     Pharynx: Oropharynx is clear. No oropharyngeal exudate or posterior oropharyngeal erythema.  Eyes:     General: No scleral icterus.       Right eye: No discharge.        Left eye: No discharge.     Extraocular Movements: Extraocular movements intact.     Conjunctiva/sclera: Conjunctivae normal.     Pupils: Pupils are equal, round, and reactive to light.  Neck:     Vascular: No carotid bruit.  Cardiovascular:     Rate and Rhythm: Normal rate and regular rhythm.     Pulses: Normal pulses.     Heart sounds: No murmur heard.    No friction rub. No gallop.  Pulmonary:     Effort: Pulmonary effort is normal. No respiratory distress.     Breath sounds: Normal breath sounds. No stridor. No wheezing, rhonchi or rales.  Chest:     Chest wall: No tenderness.  Abdominal:     General:  Abdomen is flat. Bowel sounds are normal. There is no distension.     Palpations: Abdomen is soft. There is no mass.     Tenderness: There  is no abdominal tenderness. There is no right CVA tenderness, left CVA tenderness, guarding or rebound.     Hernia: No hernia is present.  Genitourinary:    Comments: Breast and pelvic exams deferred with shared decision making Musculoskeletal:        General: No swelling, tenderness, deformity or signs of injury. Normal range of motion.     Cervical back: Normal range of motion and neck supple. No rigidity. No muscular tenderness.     Right lower leg: No edema.     Left lower leg: No edema.  Lymphadenopathy:     Cervical: No cervical adenopathy.  Skin:    General: Skin is warm and dry.     Capillary Refill: Capillary refill takes less than 2 seconds.     Coloration: Skin is not jaundiced or pale.     Findings: No bruising, erythema, lesion or rash.  Neurological:     General: No focal deficit present.     Mental Status: She is alert and oriented to person, place, and time. Mental status is at baseline.     Cranial Nerves: No cranial nerve deficit.     Sensory: No sensory deficit.     Motor: No weakness.     Coordination: Coordination normal.     Gait: Gait normal.     Deep Tendon Reflexes: Reflexes normal.  Psychiatric:        Mood and Affect: Mood normal.        Behavior: Behavior normal.        Thought Content: Thought content normal.        Judgment: Judgment normal.     Results for orders placed or performed in visit on 07/07/21  Novel Coronavirus, NAA ([facility])   Specimen: Saline  Result Value Ref Range   SARS-CoV-2, NAA Not Detected Not Detected  Rapid Strep screen([facility])   Specimen: Other   Other  Result Value Ref Range   Strep Gp A Ag, IA W/Reflex Negative Negative  Culture, Group A Strep   Other  Result Value Ref Range   Strep A Culture Negative       Assessment & Plan:   Problem List Items Addressed This  Visit   None Visit Diagnoses     Routine general medical examination at a health care facility    -  Primary   Vaccines updated. Screening labs checked today. Pap up to date. Continue diet and exercise. Call with any concerns.    Relevant Orders   CBC with Differential/Platelet   Comprehensive metabolic panel   Lipid Panel w/o Chol/HDL Ratio   Urinalysis, Routine w reflex microscopic   TSH   HIV Antibody (routine testing w rflx)   Hepatitis C Antibody   B12   Irregular periods       Will check labs. Await results. Treat as needed.    Relevant Orders   FSH   LH   Estradiol   Testosterone, free, total([facility])   DHEA-sulfate   Prolactin        Follow up plan: Return in about 1 year (around 10/16/2022) for physical, 6 month nurse only for HPV #3.   LABORATORY TESTING:  - Pap smear: up to date  IMMUNIZATIONS:   - Tdap: Tetanus vaccination status reviewed: Td vaccination indicated and given today. - Influenza: Postponed to flu season - Pneumovax: Not applicable - Prevnar: Not applicable - COVID: Up to date - HPV: Administered today   PATIENT COUNSELING:   Advised to take 1 mg  of folate supplement per day if capable of pregnancy.   Sexuality: Discussed sexually transmitted diseases, partner selection, use of condoms, avoidance of unintended pregnancy  and contraceptive alternatives.   Advised to avoid cigarette smoking.  I discussed with the patient that most people either abstain from alcohol or drink within safe limits (<=14/week and <=4 drinks/occasion for males, <=7/weeks and <= 3 drinks/occasion for females) and that the risk for alcohol disorders and other health effects rises proportionally with the number of drinks per week and how often a drinker exceeds daily limits.  Discussed cessation/primary prevention of drug use and availability of treatment for abuse.   Diet: Encouraged to adjust caloric intake to maintain  or achieve ideal body weight, to  reduce intake of dietary saturated fat and total fat, to limit sodium intake by avoiding high sodium foods and not adding table salt, and to maintain adequate dietary potassium and calcium preferably from fresh fruits, vegetables, and low-fat dairy products.    stressed the importance of regular exercise  Injury prevention: Discussed safety belts, safety helmets, smoke detector, smoking near bedding or upholstery.   Dental health: Discussed importance of regular tooth brushing, flossing, and dental visits.    NEXT PREVENTATIVE PHYSICAL DUE IN 1 YEAR. Return in about 1 year (around 10/16/2022) for physical, 6 month nurse only for HPV #3.

## 2021-10-22 LAB — CBC WITH DIFFERENTIAL/PLATELET
Basophils Absolute: 0 10*3/uL (ref 0.0–0.2)
Basos: 1 %
EOS (ABSOLUTE): 0.1 10*3/uL (ref 0.0–0.4)
Eos: 1 %
Hematocrit: 41.1 % (ref 34.0–46.6)
Hemoglobin: 13.7 g/dL (ref 11.1–15.9)
Immature Grans (Abs): 0 10*3/uL (ref 0.0–0.1)
Immature Granulocytes: 0 %
Lymphocytes Absolute: 2 10*3/uL (ref 0.7–3.1)
Lymphs: 26 %
MCH: 30.5 pg (ref 26.6–33.0)
MCHC: 33.3 g/dL (ref 31.5–35.7)
MCV: 92 fL (ref 79–97)
Monocytes Absolute: 0.6 10*3/uL (ref 0.1–0.9)
Monocytes: 8 %
Neutrophils Absolute: 4.8 10*3/uL (ref 1.4–7.0)
Neutrophils: 64 %
Platelets: 221 10*3/uL (ref 150–450)
RBC: 4.49 x10E6/uL (ref 3.77–5.28)
RDW: 12.2 % (ref 11.7–15.4)
WBC: 7.4 10*3/uL (ref 3.4–10.8)

## 2021-10-22 LAB — TSH: TSH: 0.972 u[IU]/mL (ref 0.450–4.500)

## 2021-10-22 LAB — LIPID PANEL W/O CHOL/HDL RATIO
Cholesterol, Total: 139 mg/dL (ref 100–199)
HDL: 48 mg/dL (ref >39)
LDL Chol Calc (NIH): 67 mg/dL (ref 0–99)
Triglycerides: 138 mg/dL (ref 0–149)
VLDL Cholesterol Cal: 24 mg/dL (ref 5–40)

## 2021-10-22 LAB — TESTOSTERONE, FREE, TOTAL, SHBG
Sex Hormone Binding: 18.1 nmol/L — ABNORMAL LOW (ref 24.6–122.0)
Testosterone, Free: 1.5 pg/mL (ref 0.0–4.2)
Testosterone: 21 ng/dL (ref 13–71)

## 2021-10-22 LAB — COMPREHENSIVE METABOLIC PANEL
ALT: 21 IU/L (ref 0–32)
AST: 19 IU/L (ref 0–40)
Albumin/Globulin Ratio: 2 (ref 1.2–2.2)
Albumin: 5 g/dL (ref 4.0–5.0)
Alkaline Phosphatase: 69 IU/L (ref 44–121)
BUN/Creatinine Ratio: 15 (ref 9–23)
BUN: 10 mg/dL (ref 6–20)
Bilirubin Total: 0.3 mg/dL (ref 0.0–1.2)
CO2: 24 mmol/L (ref 20–29)
Calcium: 9.9 mg/dL (ref 8.7–10.2)
Chloride: 100 mmol/L (ref 96–106)
Creatinine, Ser: 0.68 mg/dL (ref 0.57–1.00)
Globulin, Total: 2.5 g/dL (ref 1.5–4.5)
Glucose: 86 mg/dL (ref 70–99)
Potassium: 3.9 mmol/L (ref 3.5–5.2)
Sodium: 140 mmol/L (ref 134–144)
Total Protein: 7.5 g/dL (ref 6.0–8.5)
eGFR: 124 mL/min/{1.73_m2} (ref >59)

## 2021-10-22 LAB — VITAMIN B12: Vitamin B-12: 488 pg/mL (ref 232–1245)

## 2021-10-22 LAB — HEPATITIS C ANTIBODY: Hep C Virus Ab: NONREACTIVE

## 2021-10-22 LAB — PROLACTIN: Prolactin: 12.3 ng/mL (ref 4.8–23.3)

## 2021-10-22 LAB — FOLLICLE STIMULATING HORMONE: FSH: 3 m[IU]/mL

## 2021-10-22 LAB — DHEA-SULFATE: DHEA-SO4: 311 ug/dL (ref 84.8–378.0)

## 2021-10-22 LAB — ESTRADIOL: Estradiol: 74.3 pg/mL

## 2021-10-22 LAB — HIV ANTIBODY (ROUTINE TESTING W REFLEX): HIV Screen 4th Generation wRfx: NONREACTIVE

## 2021-10-22 LAB — LUTEINIZING HORMONE: LH: 6 m[IU]/mL

## 2022-04-10 ENCOUNTER — Other Ambulatory Visit: Payer: Self-pay | Admitting: Family Medicine

## 2022-04-12 NOTE — Telephone Encounter (Signed)
Requested Prescriptions  Pending Prescriptions Disp Refills   fluticasone (FLONASE) 50 MCG/ACT nasal spray [Pharmacy Med Name: FLUTICASONE PROP 50 MCG SPRAY] 16 g 0    Sig: SPRAY 2 SPRAYS INTO EACH NOSTRIL EVERY DAY     Ear, Nose, and Throat: Nasal Preparations - Corticosteroids Passed - 04/10/2022  8:24 AM      Passed - Valid encounter within last 12 months    Recent Outpatient Visits           5 months ago Routine general medical examination at a health care facility   [facility], [place], DO   9 months ago Upper respiratory tract infection, unspecified type   [place], [place], DO   1 year ago Acute cough   [place], NP   1 year ago Acute non-recurrent sinusitis, unspecified location   [place], Scheryl Darter, NP   1 year ago Routine general medical examination at a health care facility   [facility] Valerie Roys, DO       Future Appointments             In 6 months Wynetta Emery, Barb Merino, DO [place], [facility]

## 2022-04-19 ENCOUNTER — Ambulatory Visit: Payer: BC Managed Care – PPO

## 2022-04-24 ENCOUNTER — Other Ambulatory Visit: Payer: Self-pay | Admitting: Family Medicine

## 2022-04-26 NOTE — Telephone Encounter (Signed)
Unable to refill per protocol, Rx request is too soon. Last refill 04/12/22 for 16 g.  Requested Prescriptions  Pending Prescriptions Disp Refills   fluticasone (FLONASE) 50 MCG/ACT nasal spray [Pharmacy Med Name: FLUTICASONE PROP 50 MCG SPRAY] 48 mL 1    Sig: SPRAY 2 SPRAYS INTO EACH NOSTRIL EVERY DAY     Ear, Nose, and Throat: Nasal Preparations - Corticosteroids Passed - 04/24/2022  4:31 PM      Passed - Valid encounter within last 12 months    Recent Outpatient Visits           6 months ago Routine general medical examination at a health care facility   [facility], [place], DO   9 months ago Upper respiratory tract infection, unspecified type   [place], [place], DO   1 year ago Acute cough   [place], NP   1 year ago Acute non-recurrent sinusitis, unspecified location   [facility], Scheryl Darter, NP   1 year ago Routine general medical examination at a health care facility   [facility] Valerie Roys, DO       Future Appointments             In 3 weeks Valerie Roys, DO [place], [place]   In 5 months Wynetta Emery, Barb Merino, DO [place], [facility]

## 2022-04-29 ENCOUNTER — Ambulatory Visit: Payer: BC Managed Care – PPO

## 2022-05-10 ENCOUNTER — Ambulatory Visit (INDEPENDENT_AMBULATORY_CARE_PROVIDER_SITE_OTHER): Payer: BC Managed Care – PPO

## 2022-05-10 DIAGNOSIS — Z23 Encounter for immunization: Secondary | ICD-10-CM | POA: Diagnosis not present

## 2022-05-20 ENCOUNTER — Encounter: Payer: Self-pay | Admitting: Family Medicine

## 2022-05-20 ENCOUNTER — Ambulatory Visit: Payer: BC Managed Care – PPO | Admitting: Family Medicine

## 2022-05-20 VITALS — BP 100/66 | HR 84 | Temp 98.4°F | Ht 63.0 in | Wt 150.9 lb

## 2022-05-20 DIAGNOSIS — Z20828 Contact with and (suspected) exposure to other viral communicable diseases: Secondary | ICD-10-CM

## 2022-05-20 DIAGNOSIS — R41840 Attention and concentration deficit: Secondary | ICD-10-CM

## 2022-05-20 NOTE — Progress Notes (Signed)
BP 100/66   Pulse 84   Temp 98.4 F (36.9 C) (Oral)   Ht '5\' 3"'$  (1.6 m)   Wt 150 lb 14.4 oz (68.4 kg)   SpO2 100%   BMI 26.73 kg/m    Subjective:    Patient ID: Melody Pena, female    DOB: Feb 13, 1996, 27 y.o.   MRN: [id number]  HPI: Melody Pena is a 27 y.o. female  Chief Complaint  Patient presents with   ADHD    Patient says she would like to discuss possible start treatment options at today's visit.    ???ADHD- has more issues with the emotional side of her attention, has more issues with needing alone time and gets over stimulated. She has issues with procrastination, frustration tolerance is very low. Needs a lot of alone time, lots of indecision- was seeing a counselor in 2020 and was diagnosed with ADD and panic disorder, not started on anything other than counseling Status: uncontrolled Satisfied with current therapy: no Medication compliance:  N/A Controlled substance contract: N/A Previous psychiatry evaluation: no Previous medications: N/A    Work/school performance:  good Difficulty sustaining attention/completing tasks: no Distracted by extraneous stimuli: no Does not listen when spoken to: no  Fidgets with hands or feet: no Unable to stay in seat: no Blurts out/interrupts others: no ADHD Medication Side Effects: N/A   Relevant past medical, surgical, family and social history reviewed and updated as indicated. Interim medical history since our last visit reviewed. Allergies and medications reviewed and updated.  Review of Systems  Constitutional: Negative.   Respiratory: Negative.    Cardiovascular: Negative.   Musculoskeletal: Negative.   Skin: Negative.   Neurological: Negative.   Psychiatric/Behavioral:  Positive for agitation. Negative for behavioral problems, confusion, decreased concentration, dysphoric mood, hallucinations, self-injury, sleep disturbance and suicidal ideas. The patient is nervous/anxious. The patient is not hyperactive.      Per HPI unless specifically indicated above     Objective:    BP 100/66   Pulse 84   Temp 98.4 F (36.9 C) (Oral)   Ht '5\' 3"'$  (1.6 m)   Wt 150 lb 14.4 oz (68.4 kg)   SpO2 100%   BMI 26.73 kg/m   Wt Readings from Last 3 Encounters:  05/20/22 150 lb 14.4 oz (68.4 kg)  10/15/21 150 lb (68 kg)  07/07/21 144 lb 12.8 oz (65.7 kg)    Physical Exam Vitals and nursing note reviewed.  Constitutional:      General: She is not in acute distress.    Appearance: Normal appearance. She is not ill-appearing, toxic-appearing or diaphoretic.  HENT:     Head: Normocephalic and atraumatic.     Right Ear: External ear normal.     Left Ear: External ear normal.     Nose: Nose normal.     Mouth/Throat:     Mouth: Mucous membranes are moist.     Pharynx: Oropharynx is clear.  Eyes:     General: No scleral icterus.       Right eye: No discharge.        Left eye: No discharge.     Extraocular Movements: Extraocular movements intact.     Conjunctiva/sclera: Conjunctivae normal.     Pupils: Pupils are equal, round, and reactive to light.  Cardiovascular:     Rate and Rhythm: Normal rate and regular rhythm.     Pulses: Normal pulses.     Heart sounds: Normal heart sounds. No murmur heard.    No  friction rub. No gallop.  Pulmonary:     Effort: Pulmonary effort is normal. No respiratory distress.     Breath sounds: Normal breath sounds. No stridor. No wheezing, rhonchi or rales.  Chest:     Chest wall: No tenderness.  Musculoskeletal:        General: Normal range of motion.     Cervical back: Normal range of motion and neck supple.  Skin:    General: Skin is warm and dry.     Capillary Refill: Capillary refill takes less than 2 seconds.     Coloration: Skin is not jaundiced or pale.     Findings: No bruising, erythema, lesion or rash.  Neurological:     General: No focal deficit present.     Mental Status: She is alert and oriented to person, place, and time. Mental status is at  baseline.  Psychiatric:        Mood and Affect: Mood normal.        Behavior: Behavior normal.        Thought Content: Thought content normal.        Judgment: Judgment normal.    Results for orders placed or performed in visit on 10/15/21  CBC with Differential/Platelet  Result Value Ref Range   WBC 7.4 3.4 - 10.8 x10E3/uL   RBC 4.49 3.77 - 5.28 x10E6/uL   Hemoglobin 13.7 11.1 - 15.9 g/dL   Hematocrit 41.1 34.0 - 46.6 %   MCV 92 79 - 97 fL   MCH 30.5 26.6 - 33.0 pg   MCHC 33.3 31.5 - 35.7 g/dL   RDW 12.2 11.7 - 15.4 %   Platelets 221 150 - 450 x10E3/uL   Neutrophils 64 Not Estab. %   Lymphs 26 Not Estab. %   Monocytes 8 Not Estab. %   Eos 1 Not Estab. %   Basos 1 Not Estab. %   Neutrophils Absolute 4.8 1.4 - 7.0 x10E3/uL   Lymphocytes Absolute 2.0 0.7 - 3.1 x10E3/uL   Monocytes Absolute 0.6 0.1 - 0.9 x10E3/uL   EOS (ABSOLUTE) 0.1 0.0 - 0.4 x10E3/uL   Basophils Absolute 0.0 0.0 - 0.2 x10E3/uL   Immature Granulocytes 0 Not Estab. %   Immature Grans (Abs) 0.0 0.0 - 0.1 x10E3/uL  Comprehensive metabolic panel  Result Value Ref Range   Glucose 86 70 - 99 mg/dL   BUN 10 6 - 20 mg/dL   Creatinine, Ser 0.68 0.57 - 1.00 mg/dL   eGFR 124 >59 mL/min/1.73   BUN/Creatinine Ratio 15 9 - 23   Sodium 140 134 - 144 mmol/L   Potassium 3.9 3.5 - 5.2 mmol/L   Chloride 100 96 - 106 mmol/L   CO2 24 20 - 29 mmol/L   Calcium 9.9 8.7 - 10.2 mg/dL   Total Protein 7.5 6.0 - 8.5 g/dL   Albumin 5.0 4.0 - 5.0 g/dL   Globulin, Total 2.5 1.5 - 4.5 g/dL   Albumin/Globulin Ratio 2.0 1.2 - 2.2   Bilirubin Total 0.3 0.0 - 1.2 mg/dL   Alkaline Phosphatase 69 44 - 121 IU/L   AST 19 0 - 40 IU/L   ALT 21 0 - 32 IU/L  Lipid Panel w/o Chol/HDL Ratio  Result Value Ref Range   Cholesterol, Total 139 100 - 199 mg/dL   Triglycerides 138 0 - 149 mg/dL   HDL 48 >39 mg/dL   VLDL Cholesterol Cal 24 5 - 40 mg/dL   LDL Chol Calc (NIH) 67 0 - 99 mg/dL  Urinalysis, Routine w reflex microscopic  Result Value  Ref Range   Specific Gravity, UA 1.020 1.005 - 1.030   pH, UA 7.0 5.0 - 7.5   Color, UA Yellow Yellow   Appearance Ur Clear Clear   Leukocytes,UA Negative Negative   Protein,UA Negative Negative/Trace   Glucose, UA Negative Negative   Ketones, UA Negative Negative   RBC, UA Negative Negative   Bilirubin, UA Negative Negative   Urobilinogen, Ur 0.2 0.2 - 1.0 mg/dL   Nitrite, UA Negative Negative  TSH  Result Value Ref Range   TSH 0.972 0.450 - 4.500 uIU/mL  HIV Antibody (routine testing w rflx)  Result Value Ref Range   HIV Screen 4th Generation wRfx Non Reactive Non Reactive  Hepatitis C Antibody  Result Value Ref Range   Hep C Virus Ab Non Reactive Non Reactive  B12  Result Value Ref Range   Vitamin B-12 488 232 - 1,245 pg/mL  FSH  Result Value Ref Range   FSH 3.0 mIU/mL  LH  Result Value Ref Range   LH 6.0 mIU/mL  Estradiol  Result Value Ref Range   Estradiol 74.3 pg/mL  Testosterone, free, total([facility])  Result Value Ref Range   Testosterone 21 13 - 71 ng/dL   Testosterone, Free 1.5 0.0 - 4.2 pg/mL   Sex Hormone Binding 18.1 (L) 24.6 - 122.0 nmol/L  DHEA-sulfate  Result Value Ref Range   DHEA-SO4 311.0 84.8 - 378.0 ug/dL  Prolactin  Result Value Ref Range   Prolactin 12.3 4.8 - 23.3 ng/mL      Assessment & Plan:   Problem List Items Addressed This Visit   None Visit Diagnoses     Concentration deficit    -  Primary   Will get records from psychology for ?ADD diagnosis. May need neuropsych testing. Await paperwork for review. Call with any concerns.   Exposure to respiratory syncytial virus       Discussed methods for staying healthy including adequate sleep, vitamin C, D and zinc, exercise and healthy diet. Call with any concerns.        Follow up plan: Return if symptoms worsen or fail to improve.

## 2022-08-25 IMAGING — DX DG CHEST 2V
2 series · 2 of 2 positions shown · non-contrast
Comparison: None.

CLINICAL DATA: Cough.

EXAM:
CHEST - 2 VIEW

[chest pa]
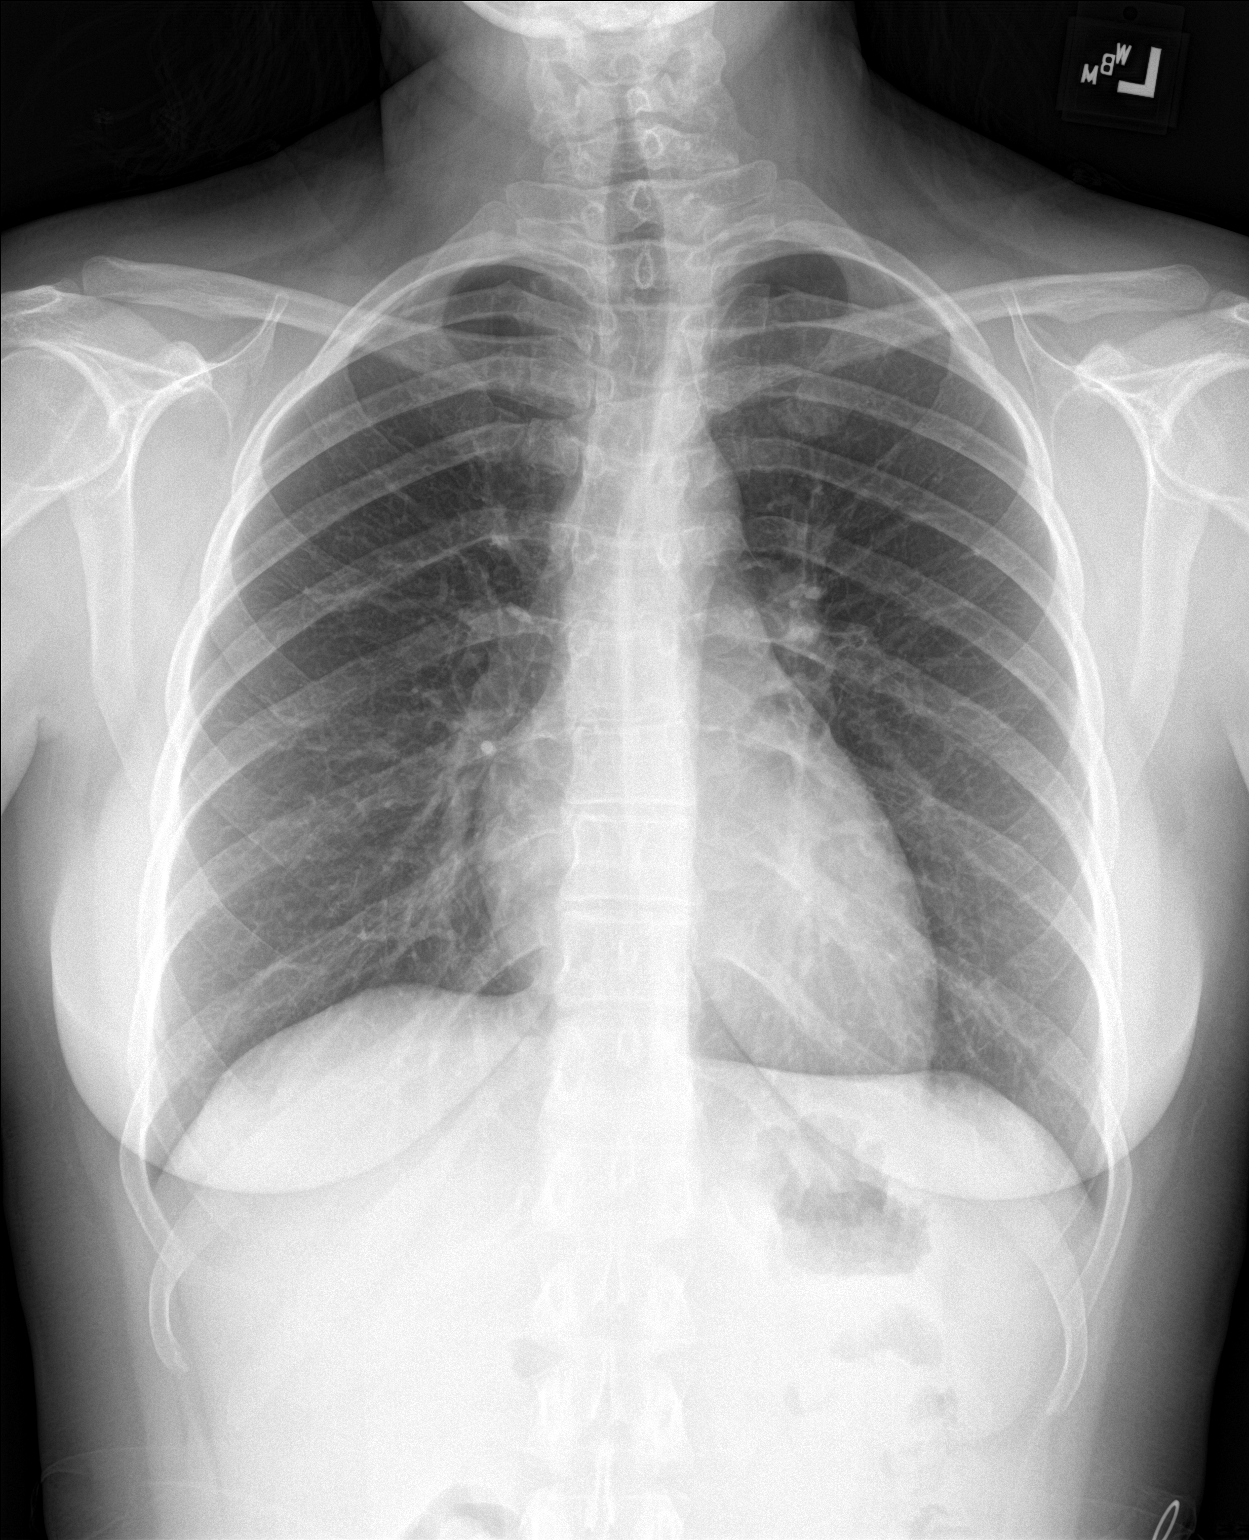

[chest lat]
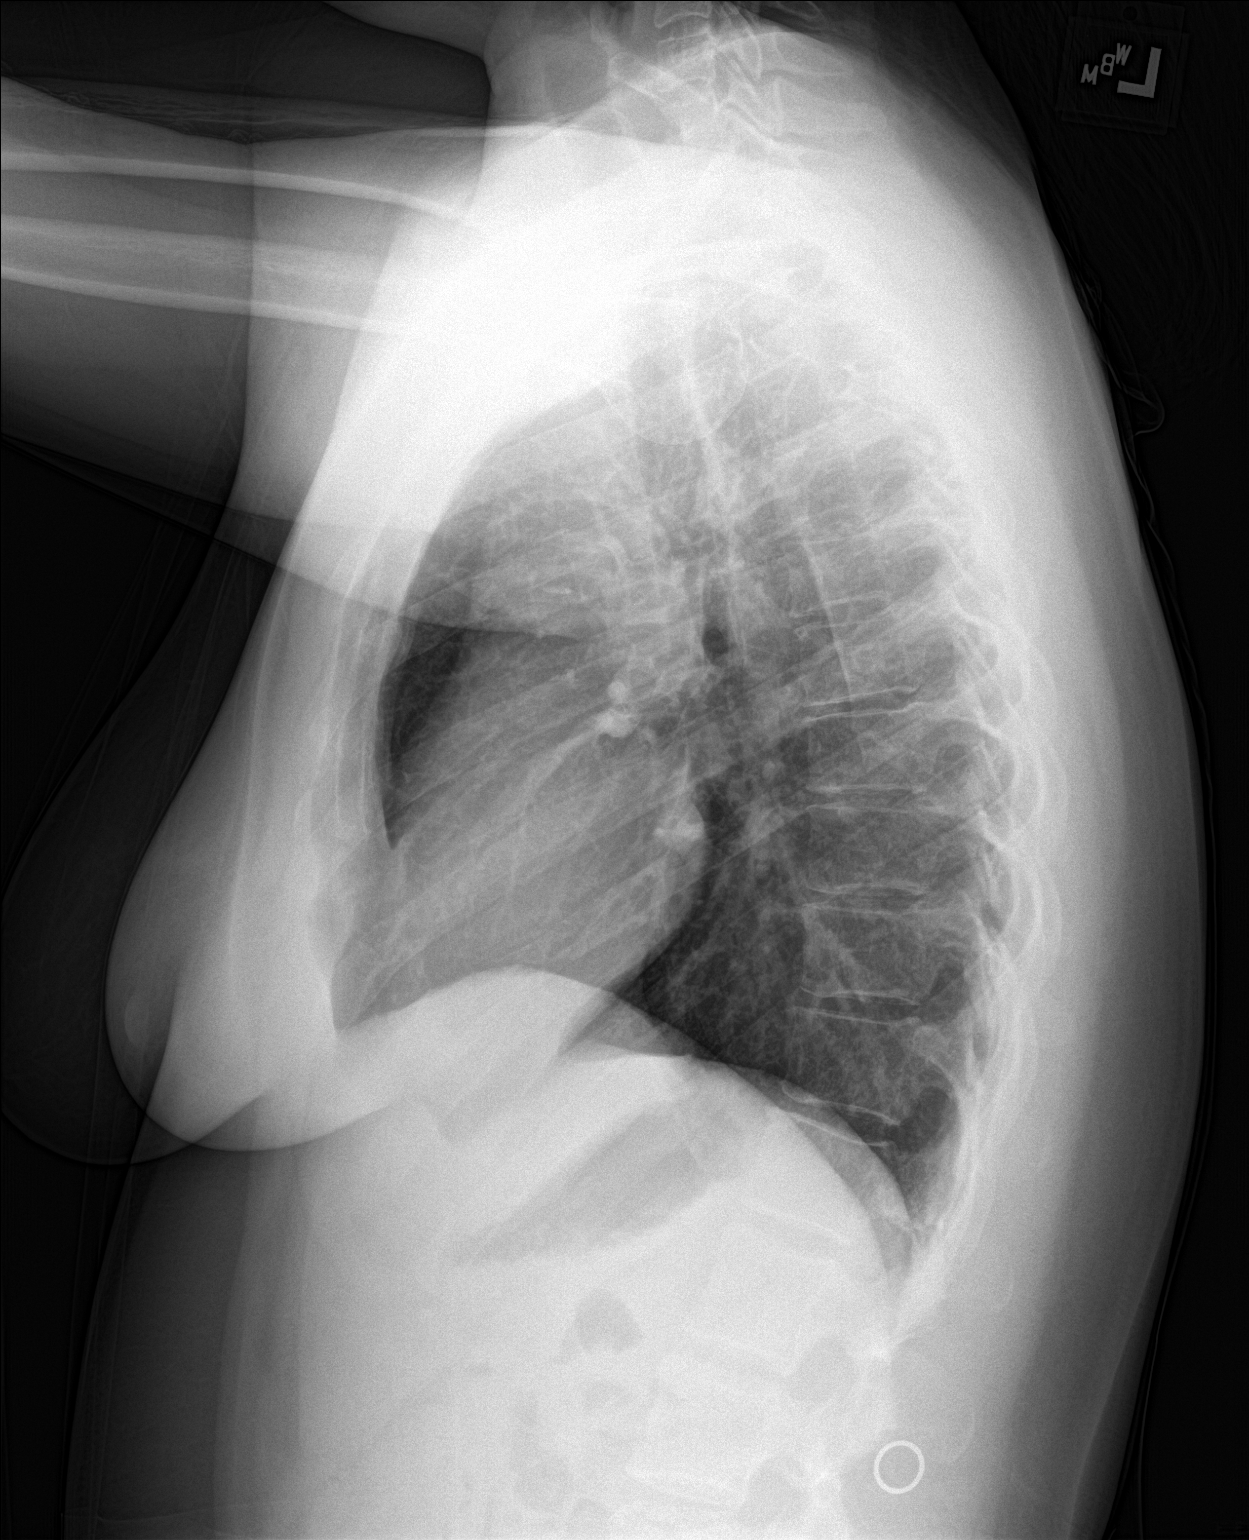

[2 of 2 positions shown; findings below may reference images not displayed]

FINDINGS: The heart size and mediastinal contours are within normal limits.
Both lungs are clear. The visualized skeletal structures are
unremarkable.
IMPRESSION: No active cardiopulmonary disease.

## 2022-10-18 ENCOUNTER — Encounter: Payer: 59 | Admitting: Family Medicine

## 2023-03-16 ENCOUNTER — Ambulatory Visit (INDEPENDENT_AMBULATORY_CARE_PROVIDER_SITE_OTHER): Payer: 59 | Admitting: Family Medicine

## 2023-03-16 ENCOUNTER — Encounter: Payer: Self-pay | Admitting: Family Medicine

## 2023-03-16 VITALS — BP 118/73 | HR 76 | Wt 140.4 lb

## 2023-03-16 DIAGNOSIS — Z803 Family history of malignant neoplasm of breast: Secondary | ICD-10-CM | POA: Diagnosis not present

## 2023-03-16 DIAGNOSIS — Z3009 Encounter for other general counseling and advice on contraception: Secondary | ICD-10-CM

## 2023-03-16 NOTE — Assessment & Plan Note (Signed)
 To discuss with living relatives if they have been tested, consider sending if not.

## 2023-03-16 NOTE — Progress Notes (Signed)
-   HPV vaccine 8/10, 3/4 PAP- [facility]  HPV - 07/24/20- [facility]    RR:[facility] in [place] since [employer], but now lives here in [place]    Currently period tracking for birth control (since 2021)  2 nexplanon  Periods are regular  Does not want to get pregnant    Wants to discuss options of birth control    Does note vaginal dryness  Denies any other concerns

## 2023-03-16 NOTE — Progress Notes (Signed)
    Subjective:    Patient ID: Catlin Doria is a 28 y.o. female presenting with New Patient (Initial Visit)  on 03/16/2023  HPI: Nexplanon x 2 in the past. Has been out since 2021. Nexplanon worked well most of the time, until the end of the 2nd Nexplanon. Had depressive symptoms, vaginal dryness and some weight gain. Had concerns that it might have been the hormones.Things got better after taking it out. Using NFP and condoms.Cycles are regular and not heavy and last 3 days. It is fairly light.  Review of Systems  Constitutional:  Negative for chills and fever.  Respiratory:  Negative for shortness of breath.   Cardiovascular:  Negative for chest pain.  Gastrointestinal:  Negative for abdominal pain, nausea and vomiting.  Genitourinary:  Negative for dysuria.  Skin:  Negative for rash.      Objective:    BP 118/73   Pulse 76   Wt 140 lb 6.4 oz (63.7 kg)   LMP 03/09/2023 (Exact Date)   BMI 24.87 kg/m  Physical Exam Exam conducted with a chaperone present.  Constitutional:      General: She is not in acute distress.    Appearance: She is well-developed.  HENT:     Head: Normocephalic and atraumatic.  Eyes:     General: No scleral icterus. Cardiovascular:     Rate and Rhythm: Normal rate.  Pulmonary:     Effort: Pulmonary effort is normal.  Abdominal:     Palpations: Abdomen is soft.  Musculoskeletal:     Cervical back: Neck supple.  Skin:    General: Skin is warm and dry.  Neurological:     Mental Status: She is alert and oriented to person, place, and time.        Assessment & Plan:   Problem List Items Addressed This Visit       Unprioritized   Family history of breast cancer   To discuss with living relatives if they have been tested, consider sending if not.      Other Visit Diagnoses       Contraceptive education    -  Primary   Discussed all options. May continue NFP, discussed failure rates. Consider CU IUD as hormone free option. Shared  decision making used.       Total time in review of prior notes, prior labs, (pap with [facility], negative HPV. S/p gardasil) history taking, review with patient, exam, note writing, discussion of options, plan for next steps, alternatives and risks and pain of treatment: 30 minutes.   Return in about 4 months (around 07/14/2023) for a CPE or sooner if desires IUD - with menses.  Glenys GORMAN Birk, MD 03/16/2023 2:25 PM

## 2023-05-21 DIAGNOSIS — J0100 Acute maxillary sinusitis, unspecified: Secondary | ICD-10-CM | POA: Diagnosis not present

## 2023-10-19 ENCOUNTER — Ambulatory Visit

## 2023-11-14 DIAGNOSIS — Z23 Encounter for immunization: Secondary | ICD-10-CM | POA: Diagnosis not present

## 2023-12-12 ENCOUNTER — Ambulatory Visit

## 2023-12-20 ENCOUNTER — Encounter (HOSPITAL_COMMUNITY): Payer: Self-pay

## 2023-12-20 ENCOUNTER — Ambulatory Visit (HOSPITAL_COMMUNITY)
Admission: RE | Admit: 2023-12-20 | Discharge: 2023-12-20 | Disposition: A | Discharge status: Home / Self Care | Source: Ambulatory Visit | Attending: Family Medicine | Admitting: Family Medicine

## 2023-12-20 VITALS — BP 106/67 | HR 87 | Temp 98.1°F | Resp 16

## 2023-12-20 DIAGNOSIS — J0100 Acute maxillary sinusitis, unspecified: Secondary | ICD-10-CM | POA: Diagnosis not present

## 2023-12-20 MED ORDER — AMOXICILLIN-POT CLAVULANATE 875-125 MG PO TABS: 1.0000 | ORAL_TABLET | Freq: Two times a day (BID) | ORAL | 0 refills | Status: DC

## 2023-12-20 NOTE — Discharge Instructions (Addendum)
 You were seen today for a sinus infection.   I have sent out an antibiotic to help with symptoms.  Please return if not improving as expected.

## 2023-12-20 NOTE — ED Triage Notes (Signed)
 Patient here today with c/o nasal congestion, ear fullness, cough, body aches, and ST X 12 days. Patient has taken Sudafed and Afrin with some relief. Patient has also used saline rinse with no relief. Mild chills and sweats last night but that was the first sign of fever. No known sick contacts.

## 2023-12-20 NOTE — ED Provider Notes (Signed)
 [facility]    CSN: [id number] Arrival date & time: 12/20/23  1356      History   Chief Complaint Chief Complaint  Patient presents with   Nasal Congestion    Cold that has lasted 12 days with consistent nasal, head, and ear congestion. Cough, body aches, sore throat. No fever. Most symptoms have improved besides congestion and cough. - Entered by patient    HPI Melody Pena is a 28 y.o. female.   Patient is here for URI symptoms for about 2 weeks.  Having sinus congestion, ear fullness, cough, body aches.  Mild chills and sweats last night.  Using saline rinses, she was using afrin x 3 days.  Sudafed.        Past Medical History:  Diagnosis Date   ADHD    Anxiety    Depression     Patient Active Problem List   Diagnosis Date Noted   Attention deficit disorder 10/01/2020   Family history of ovarian cancer 04/05/2018   Family history of breast cancer 04/05/2018    Past Surgical History:  Procedure Laterality Date   WISDOM TOOTH EXTRACTION      OB History     Gravida  0   Para  0   Term  0   Preterm  0   AB  0   Living  0      SAB  0   IAB  0   Ectopic  0   Multiple  0   Live Births  0            Home Medications    Prior to Admission medications   Medication Sig Start Date End Date Taking? Authorizing Provider  cetirizine  (ZYRTEC ) 10 MG tablet Take 1 tablet (10 mg total) by mouth daily. Patient not taking: Reported on 03/16/2023 07/07/21   Vicci Bouchard P, DO  fluticasone  (FLONASE ) 50 MCG/ACT nasal spray SPRAY 2 SPRAYS INTO EACH NOSTRIL EVERY DAY Patient not taking: Reported on 03/16/2023 04/12/22   Vicci Bouchard SQUIBB, DO    Family History Family History  Problem Relation Age of Onset   Arthritis Mother    Anxiety disorder Mother    Depression Mother    Diabetes Mother    ADD / ADHD Mother    Seizures Mother    Bipolar disorder Mother    Schizophrenia Mother    Bipolar disorder Father    Hypertension Father     Diabetes Father    Breast cancer Maternal Aunt    Leukemia Maternal Aunt    Ovarian cancer Maternal Grandmother    Diabetes Maternal Grandfather    Heart attack Paternal Grandfather     Social History Social History   Tobacco Use   Smoking status: Never   Smokeless tobacco: Never  Vaping Use   Vaping status: Never Used  Substance Use Topics   Alcohol use: Yes    Comment: on occasion   Drug use: Never     Allergies   Patient has no known allergies.   Review of Systems Review of Systems  Constitutional: Negative.   HENT:  Positive for congestion, rhinorrhea and sinus pressure.   Respiratory:  Positive for cough.   Cardiovascular: Negative.   Gastrointestinal: Negative.   Musculoskeletal: Negative.   Psychiatric/Behavioral: Negative.       Physical Exam Triage Vital Signs ED Triage Vitals  Encounter Vitals Group     BP 12/20/23 1413 106/67     Girls Systolic BP Percentile --  Girls Diastolic BP Percentile --      Boys Systolic BP Percentile --      Boys Diastolic BP Percentile --      Pulse Rate 12/20/23 1413 87     Resp 12/20/23 1413 16     Temp 12/20/23 1413 98.1 F (36.7 C)     Temp Source 12/20/23 1413 Oral     SpO2 12/20/23 1413 98 %     Weight --      Height --      Head Circumference --      Peak Flow --      Pain Score 12/20/23 1412 2     Pain Loc --      Pain Education --      Exclude from Growth Chart --    No data found.  Updated Vital Signs BP 106/67 (BP Location: Left Arm)   Pulse 87   Temp 98.1 F (36.7 C) (Oral)   Resp 16   LMP 12/13/2023 (Exact Date)   SpO2 98%   Visual Acuity Right Eye Distance:   Left Eye Distance:   Bilateral Distance:    Right Eye Near:   Left Eye Near:    Bilateral Near:     Physical Exam Constitutional:      General: She is not in acute distress.    Appearance: Normal appearance. She is normal weight. She is not ill-appearing or toxic-appearing.  HENT:     Right Ear: Tympanic membrane  normal.     Left Ear: Tympanic membrane normal.     Nose: Congestion present.     Right Sinus: Maxillary sinus tenderness present.     Left Sinus: Maxillary sinus tenderness present.  Cardiovascular:     Rate and Rhythm: Normal rate and regular rhythm.  Pulmonary:     Effort: Pulmonary effort is normal.     Breath sounds: Normal breath sounds.  Musculoskeletal:     Cervical back: Normal range of motion and neck supple. No tenderness.  Skin:    General: Skin is warm.  Neurological:     General: No focal deficit present.     Mental Status: She is alert.  Psychiatric:        Mood and Affect: Mood normal.      UC Treatments / Results  Labs (all labs ordered are listed, but only abnormal results are displayed) Labs Reviewed - No data to display  EKG   Radiology No results found.  Procedures Procedures (including critical care time)  Medications Ordered in UC Medications - No data to display  Initial Impression / Assessment and Plan / UC Course  I have reviewed the triage vital signs and the nursing notes.  Pertinent labs & imaging results that were available during my care of the patient were reviewed by me and considered in my medical decision making (see chart for details).   Final Clinical Impressions(s) / UC Diagnoses   Final diagnoses:  Acute non-recurrent maxillary sinusitis     Discharge Instructions      You were seen today for a sinus infection.   I have sent out an antibiotic to help with symptoms.  Please return if not improving as expected.     ED Prescriptions     Medication Sig Dispense Auth. Provider   amoxicillin -clavulanate (AUGMENTIN) 875-125 MG tablet Take 1 tablet by mouth every 12 (twelve) hours. 14 tablet Darral Longs, MD      PDMP not reviewed this encounter.   Laporchia Nakajima, [place],  MD 12/20/23 1432

## 2024-01-17 ENCOUNTER — Ambulatory Visit (INDEPENDENT_AMBULATORY_CARE_PROVIDER_SITE_OTHER): Admitting: Family Medicine

## 2024-01-17 ENCOUNTER — Encounter: Payer: Self-pay | Admitting: Family Medicine

## 2024-01-17 VITALS — BP 106/67 | HR 80 | Temp 98.9°F | Wt 139.1 lb

## 2024-01-17 DIAGNOSIS — Z818 Family history of other mental and behavioral disorders: Secondary | ICD-10-CM

## 2024-01-17 DIAGNOSIS — R41840 Attention and concentration deficit: Secondary | ICD-10-CM | POA: Diagnosis not present

## 2024-01-17 DIAGNOSIS — F331 Major depressive disorder, recurrent, moderate: Secondary | ICD-10-CM | POA: Insufficient documentation

## 2024-01-17 MED ORDER — BUPROPION HCL ER (SR) 150 MG PO TB12: ORAL_TABLET | ORAL | 2 refills | Status: DC

## 2024-01-17 NOTE — Progress Notes (Signed)
 BP 106/67   Pulse 80   Temp 98.9 F (37.2 C) (Oral)   Wt 139 lb 2 oz (63.1 kg)   LMP 12/13/2023 (Exact Date)   SpO2 99%   BMI 24.64 kg/m    Subjective:    Patient ID: Melody Pena, female    DOB: 04/20/1995, 28 y.o.   MRN: [id number]  HPI: Melody Pena is a 28 y.o. female  Chief Complaint  Patient presents with   Referral    Psychiatrist for mental disorder    DEPRESSION Duration: chronic, but worse in the last year Mood status: worse Satisfied with current treatment?: no Symptom severity: moderate  Duration of current treatment : not on anything Side effects: N/A Medication compliance: never been on anything Psychotherapy/counseling: yes current Previous psychiatric medications: none Depressed mood: yes Anxious mood: yes Anhedonia: no Significant weight loss or gain: no Insomnia: no  Fatigue: yes Feelings of worthlessness or guilt: yes Impaired concentration/indecisiveness: yes Suicidal ideations: no Hopelessness: no Crying spells: no    01/17/2024    9:09 AM 03/16/2023    2:22 PM 05/20/2022    3:47 PM 10/15/2021    2:34 PM 12/25/2020    1:31 PM  Depression screen PHQ 2/9  Decreased Interest 3 0 1 1 1   Down, Depressed, Hopeless 2 0 1 1 1   PHQ - 2 Score 5 0 2 2 2   Altered sleeping 1 0 0 2 0  Tired, decreased energy 2 0 2 1 2   Change in appetite 3 0 1 1 0  Feeling bad or failure about yourself  2 0 1 0 0  Trouble concentrating 3 0 2 2 1   Moving slowly or fidgety/restless 0 0 0 0 0  Suicidal thoughts 0 0 0 0 0  PHQ-9 Score 16 0  8  8  5    Difficult doing work/chores Very difficult Not difficult at all Somewhat difficult Somewhat difficult      Data saved with a previous flowsheet row definition    Relevant past medical, surgical, family and social history reviewed and updated as indicated. Interim medical history since our last visit reviewed. Allergies and medications reviewed and updated.  Review of Systems  Constitutional:  Positive for  appetite change. Negative for activity change, chills, diaphoresis, fatigue, fever and unexpected weight change.  Respiratory: Negative.    Cardiovascular: Negative.   Musculoskeletal: Negative.   Neurological: Negative.   Psychiatric/Behavioral:  Positive for decreased concentration and dysphoric mood. Negative for agitation, behavioral problems, confusion, hallucinations, self-injury, sleep disturbance and suicidal ideas. The patient is nervous/anxious. The patient is not hyperactive.     Per HPI unless specifically indicated above     Objective:    BP 106/67   Pulse 80   Temp 98.9 F (37.2 C) (Oral)   Wt 139 lb 2 oz (63.1 kg)   LMP 12/13/2023 (Exact Date)   SpO2 99%   BMI 24.64 kg/m   Wt Readings from Last 3 Encounters:  01/17/24 139 lb 2 oz (63.1 kg)  03/16/23 140 lb 6.4 oz (63.7 kg)  05/20/22 150 lb 14.4 oz (68.4 kg)    Physical Exam Vitals and nursing note reviewed.  Constitutional:      General: She is not in acute distress.    Appearance: Normal appearance. She is not ill-appearing, toxic-appearing or diaphoretic.  HENT:     Head: Normocephalic and atraumatic.     Right Ear: External ear normal.     Left Ear: External ear normal.  Nose: Nose normal.     Mouth/Throat:     Mouth: Mucous membranes are moist.     Pharynx: Oropharynx is clear.  Eyes:     General: No scleral icterus.       Right eye: No discharge.        Left eye: No discharge.     Extraocular Movements: Extraocular movements intact.     Conjunctiva/sclera: Conjunctivae normal.     Pupils: Pupils are equal, round, and reactive to light.  Cardiovascular:     Rate and Rhythm: Normal rate and regular rhythm.     Pulses: Normal pulses.     Heart sounds: Normal heart sounds. No murmur heard.    No friction rub. No gallop.  Pulmonary:     Effort: Pulmonary effort is normal. No respiratory distress.     Breath sounds: Normal breath sounds. No stridor. No wheezing, rhonchi or rales.  Chest:      Chest wall: No tenderness.  Musculoskeletal:        General: Normal range of motion.     Cervical back: Normal range of motion and neck supple.  Skin:    General: Skin is warm and dry.     Capillary Refill: Capillary refill takes less than 2 seconds.     Coloration: Skin is not jaundiced or pale.     Findings: No bruising, erythema, lesion or rash.  Neurological:     General: No focal deficit present.     Mental Status: She is alert and oriented to person, place, and time. Mental status is at baseline.  Psychiatric:        Mood and Affect: Mood normal.        Behavior: Behavior normal.        Thought Content: Thought content normal.        Judgment: Judgment normal.     Results for orders placed or performed in visit on 10/15/21  Urinalysis, Routine w reflex microscopic   Collection Time: 10/15/21  3:07 PM  Result Value Ref Range   Specific Gravity, UA 1.020 1.005 - 1.030   pH, UA 7.0 5.0 - 7.5   Color, UA Yellow Yellow   Appearance Ur Clear Clear   Leukocytes,UA Negative Negative   Protein,UA Negative Negative/Trace   Glucose, UA Negative Negative   Ketones, UA Negative Negative   RBC, UA Negative Negative   Bilirubin, UA Negative Negative   Urobilinogen, Ur 0.2 0.2 - 1.0 mg/dL   Nitrite, UA Negative Negative  CBC with Differential/Platelet   Collection Time: 10/15/21  3:10 PM  Result Value Ref Range   WBC 7.4 3.4 - 10.8 x10E3/uL   RBC 4.49 3.77 - 5.28 x10E6/uL   Hemoglobin 13.7 11.1 - 15.9 g/dL   Hematocrit 58.8 65.9 - 46.6 %   MCV 92 79 - 97 fL   MCH 30.5 26.6 - 33.0 pg   MCHC 33.3 31.5 - 35.7 g/dL   RDW 87.7 88.2 - 84.5 %   Platelets 221 150 - 450 x10E3/uL   Neutrophils 64 Not Estab. %   Lymphs 26 Not Estab. %   Monocytes 8 Not Estab. %   Eos 1 Not Estab. %   Basos 1 Not Estab. %   Neutrophils Absolute 4.8 1.4 - 7.0 x10E3/uL   Lymphocytes Absolute 2.0 0.7 - 3.1 x10E3/uL   Monocytes Absolute 0.6 0.1 - 0.9 x10E3/uL   EOS (ABSOLUTE) 0.1 0.0 - 0.4 x10E3/uL    Basophils Absolute 0.0 0.0 - 0.2 x10E3/uL  Immature Granulocytes 0 Not Estab. %   Immature Grans (Abs) 0.0 0.0 - 0.1 x10E3/uL  Comprehensive metabolic panel   Collection Time: 10/15/21  3:10 PM  Result Value Ref Range   Glucose 86 70 - 99 mg/dL   BUN 10 6 - 20 mg/dL   Creatinine, Ser 9.31 0.57 - 1.00 mg/dL   eGFR 875 >40 fO/fpw/8.26   BUN/Creatinine Ratio 15 9 - 23   Sodium 140 134 - 144 mmol/L   Potassium 3.9 3.5 - 5.2 mmol/L   Chloride 100 96 - 106 mmol/L   CO2 24 20 - 29 mmol/L   Calcium 9.9 8.7 - 10.2 mg/dL   Total Protein 7.5 6.0 - 8.5 g/dL   Albumin 5.0 4.0 - 5.0 g/dL   Globulin, Total 2.5 1.5 - 4.5 g/dL   Albumin/Globulin Ratio 2.0 1.2 - 2.2   Bilirubin Total 0.3 0.0 - 1.2 mg/dL   Alkaline Phosphatase 69 44 - 121 IU/L   AST 19 0 - 40 IU/L   ALT 21 0 - 32 IU/L  Lipid Panel w/o Chol/HDL Ratio   Collection Time: 10/15/21  3:10 PM  Result Value Ref Range   Cholesterol, Total 139 100 - 199 mg/dL   Triglycerides 861 0 - 149 mg/dL   HDL 48 >60 mg/dL   VLDL Cholesterol Cal 24 5 - 40 mg/dL   LDL Chol Calc (NIH) 67 0 - 99 mg/dL  TSH   Collection Time: 10/15/21  3:10 PM  Result Value Ref Range   TSH 0.972 0.450 - 4.500 uIU/mL  HIV Antibody (routine testing w rflx)   Collection Time: 10/15/21  3:10 PM  Result Value Ref Range   HIV Screen 4th Generation wRfx Non Reactive Non Reactive  Hepatitis C Antibody   Collection Time: 10/15/21  3:10 PM  Result Value Ref Range   Hep C Virus Ab Non Reactive Non Reactive  B12   Collection Time: 10/15/21  3:10 PM  Result Value Ref Range   Vitamin B-12 488 232 - 1,245 pg/mL  [facility]   Collection Time: 10/15/21  3:10 PM  Result Value Ref Range   FSH 3.0 mIU/mL  LH   Collection Time: 10/15/21  3:10 PM  Result Value Ref Range   LH 6.0 mIU/mL  Estradiol    Collection Time: 10/15/21  3:10 PM  Result Value Ref Range   Estradiol  74.3 pg/mL  Testosterone , free, total([facility])   Collection Time: 10/15/21  3:10 PM  Result Value Ref  Range   Testosterone  21 13 - 71 ng/dL   Testosterone , Free 1.5 0.0 - 4.2 pg/mL   Sex Hormone Binding 18.1 (L) 24.6 - 122.0 nmol/L  DHEA-sulfate   Collection Time: 10/15/21  3:10 PM  Result Value Ref Range   DHEA-SO4 311.0 84.8 - 378.0 ug/dL  Prolactin   Collection Time: 10/15/21  3:10 PM  Result Value Ref Range   Prolactin 12.3 4.8 - 23.3 ng/mL      Assessment & Plan:   Problem List Items Addressed This Visit       Other   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 Call with any concerns. Continue to monitor       Relevant Medications   buPROPion (WELLBUTRIN SR) 150 MG 12 hr tablet   Other Relevant Orders   Ambulatory referral to Psychiatry   Other Visit Diagnoses       Family history of bipolar  disorder       Relevant Orders   Ambulatory referral to Psychiatry     Concentration deficit       Relevant Orders   Ambulatory referral to Psychiatry        Follow up plan: Return in about 3 weeks (around 02/07/2024) for OK to use same day.

## 2024-01-17 NOTE — Assessment & Plan Note (Signed)
 Will start her on wellbutrin and recheck in 2-3 weeks given family history of bipolar. Call with any concerns. Continue to monitor

## 2024-02-02 ENCOUNTER — Other Ambulatory Visit: Payer: Self-pay | Admitting: Medical Genetics

## 2024-02-07 ENCOUNTER — Telehealth: Payer: Self-pay | Admitting: Family Medicine

## 2024-02-07 NOTE — Telephone Encounter (Unsigned)
 Copied from CRM #[id number]. Topic: Appointment Scheduling - Scheduling Inquiry for Clinic >> Feb 06, 2024  8:50 AM Iris P wrote: Reason for CRM: If patient calls the office please ask if she will be available to come to her appointment at 3:20 instead of the scheduled time due to a scheduling issue. If this time will will work for her please send message to overbook at 3:20per Dr.Johnson >> Feb 06, 2024  5:18 PM Shanda MATSU wrote: Patient called back to adv that she cannot make it at 3:20pm, but she can make it at 3:30pm, patient is req a call back to see if this will work.

## 2024-02-09 ENCOUNTER — Ambulatory Visit: Admitting: Family Medicine

## 2024-02-09 ENCOUNTER — Other Ambulatory Visit: Payer: Self-pay | Admitting: Family Medicine

## 2024-02-11 NOTE — Telephone Encounter (Signed)
 Requested Prescriptions  Pending Prescriptions Disp Refills   buPROPion  (WELLBUTRIN  SR) 150 MG 12 hr tablet [Pharmacy Med Name: BUPROPION  HCL SR 150 MG TABLET] 180 tablet 1    Sig: TAKE 1 TABLET BY MOUTH EVERY MORNING FOR 1 WEEK, THEN INCREASE TO 1 TABLET TWICE A DAY     Psychiatry: Antidepressants - bupropion  Failed - 02/11/2024  8:31 PM      Failed - Cr in normal range and within 360 days    Creatinine, Ser  Date Value Ref Range Status  10/15/2021 0.68 0.57 - 1.00 mg/dL Final         Failed - AST in normal range and within 360 days    AST  Date Value Ref Range Status  10/15/2021 19 0 - 40 IU/L Final         Failed - ALT in normal range and within 360 days    ALT  Date Value Ref Range Status  10/15/2021 21 0 - 32 IU/L Final         Passed - Completed PHQ-2 or PHQ-9 in the last 360 days      Passed - Last BP in normal range    BP Readings from Last 1 Encounters:  01/17/24 106/67         Passed - Valid encounter within last 6 months    Recent Outpatient Visits           3 weeks ago Moderate recurrent major depression [facility])   [facility] [place], Effie, DO

## 2024-02-13 ENCOUNTER — Telehealth: Admitting: Family Medicine

## 2024-02-13 VITALS — Wt 145.0 lb

## 2024-02-13 DIAGNOSIS — F331 Major depressive disorder, recurrent, moderate: Secondary | ICD-10-CM

## 2024-02-13 MED ORDER — BUPROPION HCL ER (XL) 150 MG PO TB24: ORAL_TABLET | ORAL | 3 refills | Status: DC

## 2024-02-13 NOTE — Progress Notes (Signed)
 Wt 145 lb (65.8 kg)   BMI 25.69 kg/m    Subjective:    Patient ID: Melody Pena, female    DOB: 27-Jan-1996, 27 y.o.   MRN: [id number]  HPI: Melody Pena is a 28 y.o. female  Chief Complaint  Patient presents with   Depression    Pt states that she has feel better on the Wellbutrin . Better mood.Small side effect having trouble staying asleep. Would like to switch to the XR Wellbutrin .    DEPRESSION Mood status: better Satisfied with current treatment?: no Symptom severity: mild  Duration of current treatment : weeks Side effects: no Medication compliance: excellent compliance Psychotherapy/counseling: no  Previous psychiatric medications: wellbutrin  Depressed mood: yes Anxious mood: no Anhedonia: no Significant weight loss or gain: no Insomnia: no  Fatigue: no Feelings of worthlessness or guilt: no Impaired concentration/indecisiveness: yes Suicidal ideations: no Hopelessness: no Crying spells: no    02/13/2024    2:25 PM 01/17/2024    9:09 AM 03/16/2023    2:22 PM 05/20/2022    3:47 PM 10/15/2021    2:34 PM  Depression screen PHQ 2/9  Decreased Interest 1 3 0 1 1  Down, Depressed, Hopeless 1 2 0 1 1  PHQ - 2 Score 2 5 0 2 2  Altered sleeping 2 1 0 0 2  Tired, decreased energy 0 2 0 2 1  Change in appetite 3 3 0 1 1  Feeling bad or failure about yourself  1 2 0 1 0  Trouble concentrating 3 3 0 2 2  Moving slowly or fidgety/restless 0 0 0 0 0  Suicidal thoughts 0 0 0 0 0  PHQ-9 Score 11 16 0  8  8   Difficult doing work/chores Very difficult Very difficult Not difficult at all Somewhat difficult Somewhat difficult     Data saved with a previous flowsheet row definition    Relevant past medical, surgical, family and social history reviewed and updated as indicated. Interim medical history since our last visit reviewed. Allergies and medications reviewed and updated.  Review of Systems  Constitutional: Negative.   Respiratory: Negative.     Cardiovascular: Negative.   Neurological: Negative.   Psychiatric/Behavioral:  Positive for decreased concentration and dysphoric mood. Negative for agitation, behavioral problems, confusion, hallucinations, self-injury, sleep disturbance and suicidal ideas. The patient is not nervous/anxious and is not hyperactive.     Per HPI unless specifically indicated above     Objective:    Wt 145 lb (65.8 kg)   BMI 25.69 kg/m   Wt Readings from Last 3 Encounters:  02/13/24 145 lb (65.8 kg)  01/17/24 139 lb 2 oz (63.1 kg)  03/16/23 140 lb 6.4 oz (63.7 kg)    Physical Exam Vitals and nursing note reviewed.  Constitutional:      General: She is not in acute distress.    Appearance: Normal appearance. She is not ill-appearing, toxic-appearing or diaphoretic.  HENT:     Head: Normocephalic and atraumatic.     Right Ear: External ear normal.     Left Ear: External ear normal.     Nose: Nose normal.     Mouth/Throat:     Mouth: Mucous membranes are moist.     Pharynx: Oropharynx is clear.  Eyes:     General: No scleral icterus.       Right eye: No discharge.        Left eye: No discharge.     Conjunctiva/sclera: Conjunctivae normal.  Pupils: Pupils are equal, round, and reactive to light.  Pulmonary:     Effort: Pulmonary effort is normal. No respiratory distress.     Comments: Speaking in full sentences Musculoskeletal:        General: Normal range of motion.     Cervical back: Normal range of motion.  Skin:    Coloration: Skin is not jaundiced or pale.     Findings: No bruising, erythema, lesion or rash.  Neurological:     Mental Status: She is alert and oriented to person, place, and time. Mental status is at baseline.  Psychiatric:        Mood and Affect: Mood normal.        Behavior: Behavior normal.        Thought Content: Thought content normal.        Judgment: Judgment normal.     Results for orders placed or performed in visit on 10/15/21  Urinalysis, Routine w  reflex microscopic   Collection Time: 10/15/21  3:07 PM  Result Value Ref Range   Specific Gravity, UA 1.020 1.005 - 1.030   pH, UA 7.0 5.0 - 7.5   Color, UA Yellow Yellow   Appearance Ur Clear Clear   Leukocytes,UA Negative Negative   Protein,UA Negative Negative/Trace   Glucose, UA Negative Negative   Ketones, UA Negative Negative   RBC, UA Negative Negative   Bilirubin, UA Negative Negative   Urobilinogen, Ur 0.2 0.2 - 1.0 mg/dL   Nitrite, UA Negative Negative  CBC with Differential/Platelet   Collection Time: 10/15/21  3:10 PM  Result Value Ref Range   WBC 7.4 3.4 - 10.8 x10E3/uL   RBC 4.49 3.77 - 5.28 x10E6/uL   Hemoglobin 13.7 11.1 - 15.9 g/dL   Hematocrit 58.8 65.9 - 46.6 %   MCV 92 79 - 97 fL   MCH 30.5 26.6 - 33.0 pg   MCHC 33.3 31.5 - 35.7 g/dL   RDW 87.7 88.2 - 84.5 %   Platelets 221 150 - 450 x10E3/uL   Neutrophils 64 Not Estab. %   Lymphs 26 Not Estab. %   Monocytes 8 Not Estab. %   Eos 1 Not Estab. %   Basos 1 Not Estab. %   Neutrophils Absolute 4.8 1.4 - 7.0 x10E3/uL   Lymphocytes Absolute 2.0 0.7 - 3.1 x10E3/uL   Monocytes Absolute 0.6 0.1 - 0.9 x10E3/uL   EOS (ABSOLUTE) 0.1 0.0 - 0.4 x10E3/uL   Basophils Absolute 0.0 0.0 - 0.2 x10E3/uL   Immature Granulocytes 0 Not Estab. %   Immature Grans (Abs) 0.0 0.0 - 0.1 x10E3/uL  Comprehensive metabolic panel   Collection Time: 10/15/21  3:10 PM  Result Value Ref Range   Glucose 86 70 - 99 mg/dL   BUN 10 6 - 20 mg/dL   Creatinine, Ser 9.31 0.57 - 1.00 mg/dL   eGFR 875 >40 fO/fpw/8.26   BUN/Creatinine Ratio 15 9 - 23   Sodium 140 134 - 144 mmol/L   Potassium 3.9 3.5 - 5.2 mmol/L   Chloride 100 96 - 106 mmol/L   CO2 24 20 - 29 mmol/L   Calcium 9.9 8.7 - 10.2 mg/dL   Total Protein 7.5 6.0 - 8.5 g/dL   Albumin 5.0 4.0 - 5.0 g/dL   Globulin, Total 2.5 1.5 - 4.5 g/dL   Albumin/Globulin Ratio 2.0 1.2 - 2.2   Bilirubin Total 0.3 0.0 - 1.2 mg/dL   Alkaline Phosphatase 69 44 - 121 IU/L   AST 19 0 -  40 IU/L    ALT 21 0 - 32 IU/L  Lipid Panel w/o Chol/HDL Ratio   Collection Time: 10/15/21  3:10 PM  Result Value Ref Range   Cholesterol, Total 139 100 - 199 mg/dL   Triglycerides 861 0 - 149 mg/dL   HDL 48 >60 mg/dL   VLDL Cholesterol Cal 24 5 - 40 mg/dL   LDL Chol Calc (NIH) 67 0 - 99 mg/dL  TSH   Collection Time: 10/15/21  3:10 PM  Result Value Ref Range   TSH 0.972 0.450 - 4.500 uIU/mL  HIV Antibody (routine testing w rflx)   Collection Time: 10/15/21  3:10 PM  Result Value Ref Range   HIV Screen 4th Generation wRfx Non Reactive Non Reactive  Hepatitis C Antibody   Collection Time: 10/15/21  3:10 PM  Result Value Ref Range   Hep C Virus Ab Non Reactive Non Reactive  B12   Collection Time: 10/15/21  3:10 PM  Result Value Ref Range   Vitamin B-12 488 232 - 1,245 pg/mL  [facility]   Collection Time: 10/15/21  3:10 PM  Result Value Ref Range   FSH 3.0 mIU/mL  LH   Collection Time: 10/15/21  3:10 PM  Result Value Ref Range   LH 6.0 mIU/mL  Estradiol    Collection Time: 10/15/21  3:10 PM  Result Value Ref Range   Estradiol  74.3 pg/mL  Testosterone , free, total([facility])   Collection Time: 10/15/21  3:10 PM  Result Value Ref Range   Testosterone  21 13 - 71 ng/dL   Testosterone , Free 1.5 0.0 - 4.2 pg/mL   Sex Hormone Binding 18.1 (L) 24.6 - 122.0 nmol/L  DHEA-sulfate   Collection Time: 10/15/21  3:10 PM  Result Value Ref Range   DHEA-SO4 311.0 84.8 - 378.0 ug/dL  Prolactin   Collection Time: 10/15/21  3:10 PM  Result Value Ref Range   Prolactin 12.3 4.8 - 23.3 ng/mL      Assessment & Plan:   Problem List Items Addressed This Visit       Other   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 Still not having issues with initiating tasks. Will change to XR wellbutrin  for 2 weeks then increase to 300mg  daily. Call with any concerns. Follow up in about a month.       Relevant Medications   buPROPion  (WELLBUTRIN  XL) 150 MG 24 hr  tablet     Follow up plan: Return in about 4 weeks (around 03/12/2024) for virtual OK.   This visit was completed via video visit through MyChart due to the restrictions of the COVID-19 pandemic. All issues as above were discussed and addressed. Physical exam was done as above through visual confirmation on video through MyChart. If it was felt that the patient should be evaluated in the office, they were directed there. The patient verbally consented to this visit. Location of the patient: home Location of the provider: work Those involved with this call:  Provider: Duwaine Louder, DO CMA: York Fogo, CMA, Front Desk/Registration: Claretta Maiden  Time spent on call: 15 minutes with patient face to face via video conference. More than 50% of this time was spent in counseling and coordination of care. 25 minutes total spent in review of patient's record and preparation of their chart.

## 2024-02-13 NOTE — Assessment & Plan Note (Signed)
 Doing better, but not sleeping well. Still not having issues with initiating tasks. Will change to XR wellbutrin  for 2 weeks then increase to 300mg  daily. Call with any concerns. Follow up in about a month.

## 2024-02-17 ENCOUNTER — Other Ambulatory Visit: Payer: Self-pay

## 2024-02-17 ENCOUNTER — Ambulatory Visit
Admission: RE | Admit: 2024-02-17 | Discharge: 2024-02-17 | Disposition: A | Discharge status: Home / Self Care | Source: Ambulatory Visit | Attending: Family Medicine | Admitting: Family Medicine

## 2024-02-17 VITALS — BP 112/68 | HR 104 | Temp 98.4°F | Resp 19 | Ht 62.0 in | Wt 140.0 lb

## 2024-02-17 DIAGNOSIS — J069 Acute upper respiratory infection, unspecified: Secondary | ICD-10-CM | POA: Diagnosis not present

## 2024-02-17 DIAGNOSIS — R509 Fever, unspecified: Secondary | ICD-10-CM | POA: Diagnosis not present

## 2024-02-17 LAB — POC COVID19/FLU A&B COMBO
Covid Antigen, POC: NEGATIVE
Influenza A Antigen, POC: NEGATIVE
Influenza B Antigen, POC: NEGATIVE

## 2024-02-17 LAB — POCT RAPID STREP A (OFFICE): Rapid Strep A Screen: NEGATIVE

## 2024-02-17 MED ORDER — AZITHROMYCIN 250 MG PO TABS: 250.0000 mg | ORAL_TABLET | Freq: Every day | ORAL | 0 refills | Status: DC

## 2024-02-17 NOTE — ED Triage Notes (Addendum)
 Pt presents with complaints of sore throat, generalized body aches, and fevers. Symptoms began last night. Currently rates overall throat pain a 6/10. No medications taken PTA for symptoms reported. Denies sick contacts. Temperature this morning was 100.4 F upon wakening.

## 2024-02-17 NOTE — Discharge Instructions (Addendum)
 Advised patient take medication as directed with food to completion.  Advised patient to take OTC Tylenol 1000 mg every 6 hours for fever (oral temperature greater than 100.4).  Encouraged increase daily water intake to 64 ounces per day while taking this medication.  Advised if symptoms worsen and/or unresolved please follow-up with your PCP or here for further evaluation.

## 2024-02-17 NOTE — ED Provider Notes (Signed)
 [facility]    CSN: [id number] Arrival date & time: 02/17/24  9070      History   Chief Complaint Chief Complaint  Patient presents with   Sore Throat    Fever and sore throat. Symptoms began a week ago, cleared up, and came back. - Entered by patient    HPI Melody Pena is a 28 y.o. female.   HPI 28 year old female presents with fever, generalized bodyaches, and sore throat that began last night.SABRA  PMH significant for ADHD and anxiety with depression.  Past Medical History:  Diagnosis Date   ADHD    Anxiety    Depression     Patient Active Problem List   Diagnosis Date Noted   Moderate recurrent major depression (HCC) 01/17/2024   Attention deficit disorder 10/01/2020   Family history of ovarian cancer 04/05/2018   Family history of breast cancer 04/05/2018    Past Surgical History:  Procedure Laterality Date   WISDOM TOOTH EXTRACTION      OB History     Gravida  0   Para  0   Term  0   Preterm  0   AB  0   Living  0      SAB  0   IAB  0   Ectopic  0   Multiple  0   Live Births  0            Home Medications    Prior to Admission medications  Medication Sig Start Date End Date Taking? Authorizing Provider  azithromycin (ZITHROMAX) 250 MG tablet Take 1 tablet (250 mg total) by mouth daily. Take first 2 tablets together, then 1 every day until finished. 02/17/24  Yes Teddy Sharper, FNP  buPROPion  (WELLBUTRIN  XL) 150 MG 24 hr tablet Take 1 pill in the AM for 2 weeks, then increase to 2 pills in the AM 02/13/24   Vicci Duwaine SQUIBB, DO    Family History Family History  Problem Relation Age of Onset   Arthritis Mother    Anxiety disorder Mother    Depression Mother    Diabetes Mother    ADD / ADHD Mother    Seizures Mother    Bipolar disorder Mother    Schizophrenia Mother    Bipolar disorder Father    Hypertension Father    Diabetes Father    Breast cancer Maternal Aunt    Leukemia Maternal Aunt    Ovarian  cancer Maternal Grandmother    Diabetes Maternal Grandfather    Heart attack Paternal Grandfather     Social History Social History[1]   Allergies   Patient has no known allergies.   Review of Systems Review of Systems  Constitutional:  Positive for fever.  HENT:  Positive for sore throat.   Musculoskeletal:  Positive for arthralgias and myalgias.  All other systems reviewed and are negative.    Physical Exam Triage Vital Signs ED Triage Vitals  Encounter Vitals Group     BP      Girls Systolic BP Percentile      Girls Diastolic BP Percentile      Boys Systolic BP Percentile      Boys Diastolic BP Percentile      Pulse      Resp      Temp      Temp src      SpO2      Weight      Height      Head Circumference  Peak Flow      Pain Score      Pain Loc      Pain Education      Exclude from Growth Chart    No data found.  Updated Vital Signs BP 112/68 (BP Location: Right Arm)   Pulse (!) 104   Temp 98.4 F (36.9 C) (Oral)   Resp 19   Ht 5' 2 (1.575 m)   Wt 140 lb (63.5 kg)   LMP 02/13/2024 (Exact Date)   SpO2 100%   BMI 25.61 kg/m    Physical Exam Vitals and nursing note reviewed.  Constitutional:      Appearance: Normal appearance. She is well-developed and normal weight. She is ill-appearing.  HENT:     Head: Normocephalic and atraumatic.     Right Ear: Tympanic membrane, ear canal and external ear normal.     Left Ear: Tympanic membrane, ear canal and external ear normal.     Mouth/Throat:     Mouth: Mucous membranes are dry.     Pharynx: Oropharynx is clear. Uvula midline.  Eyes:     Conjunctiva/sclera: Conjunctivae normal.     Pupils: Pupils are equal, round, and reactive to light.  Cardiovascular:     Rate and Rhythm: Normal rate and regular rhythm.     Heart sounds: Normal heart sounds. No murmur heard. Pulmonary:     Effort: Pulmonary effort is normal.     Breath sounds: Normal breath sounds. No wheezing, rhonchi or rales.      Comments: Infrequent nonproductive cough on exam Skin:    General: Skin is warm and dry.  Neurological:     General: No focal deficit present.     Mental Status: She is alert and oriented to person, place, and time.  Psychiatric:        Mood and Affect: Mood normal.        Behavior: Behavior normal.      UC Treatments / Results  Labs (all labs ordered are listed, but only abnormal results are displayed) Labs Reviewed  POC COVID19/FLU A&B COMBO  POCT RAPID STREP A (OFFICE)    EKG   Radiology No results found.  Procedures Procedures (including critical care time)  Medications Ordered in UC Medications - No data to display  Initial Impression / Assessment and Plan / UC Course  I have reviewed the triage vital signs and the nursing notes.  Pertinent labs & imaging results that were available during my care of the patient were reviewed by me and considered in my medical decision making (see chart for details).     MDM: 1.  Acute URI-Rx'd Zithromax: Take as directed; 2.  Fever, unspecified-Advised patient take medication as directed with food to completion.  Advised patient to take OTC Tylenol 1000 mg every 6 hours for fever (oral temperature greater than 100.4).  Encouraged increase daily water intake to 64 ounces per day while taking this medication.  Advised if symptoms worsen and/or unresolved please follow-up with your PCP or here for further evaluation. Final Clinical Impressions(s) / UC Diagnoses   Final diagnoses:  Fever, unspecified  Acute URI     Discharge Instructions      Advised patient take medication as directed with food to completion.  Advised patient to take OTC Tylenol 1000 mg every 6 hours for fever (oral temperature greater than 100.4).  Encouraged increase daily water intake to 64 ounces per day while taking this medication.  Advised if symptoms worsen and/or unresolved please  follow-up with your PCP or here for further evaluation.     ED  Prescriptions     Medication Sig Dispense Auth. Provider   azithromycin (ZITHROMAX) 250 MG tablet Take 1 tablet (250 mg total) by mouth daily. Take first 2 tablets together, then 1 every day until finished. 6 tablet Dalila Arca, FNP      PDMP not reviewed this encounter.    [1]  Social History Tobacco Use   Smoking status: Never   Smokeless tobacco: Never  Vaping Use   Vaping status: Never Used  Substance Use Topics   Alcohol use: Yes    Comment: on occasion   Drug use: Never     Teddy Sharper, FNP 02/17/24 1118

## 2024-03-11 ENCOUNTER — Other Ambulatory Visit: Payer: Self-pay | Admitting: Family Medicine

## 2024-03-13 NOTE — Telephone Encounter (Signed)
 Requested Prescriptions  Refused Prescriptions Disp Refills   buPROPion  (WELLBUTRIN  XL) 150 MG 24 hr tablet [Pharmacy Med Name: BUPROPION  HCL XL 150 MG TABLET] 180 tablet 2    Sig: TAKE 1 TAB IN THE AM FOR 2 WEEKS, THEN INCREASE TO 2 TABS IN THE AM     Psychiatry: Antidepressants - bupropion  Failed - 03/13/2024 10:39 AM      Failed - Cr in normal range and within 360 days    Creatinine, Ser  Date Value Ref Range Status  10/15/2021 0.68 0.57 - 1.00 mg/dL Final         Failed - AST in normal range and within 360 days    AST  Date Value Ref Range Status  10/15/2021 19 0 - 40 IU/L Final         Failed - ALT in normal range and within 360 days    ALT  Date Value Ref Range Status  10/15/2021 21 0 - 32 IU/L Final         Passed - Completed PHQ-2 or PHQ-9 in the last 360 days      Passed - Last BP in normal range    BP Readings from Last 1 Encounters:  02/17/24 112/68         Passed - Valid encounter within last 6 months    Recent Outpatient Visits           4 weeks ago Moderate recurrent major depression (HCC)   [facility] L[place], Megan P, DO   1 month ago Moderate recurrent major depression [facility])   [facility] [place], [place], DO

## 2024-03-23 ENCOUNTER — Telehealth: Admitting: Family Medicine

## 2024-03-23 ENCOUNTER — Encounter: Payer: Self-pay | Admitting: Family Medicine

## 2024-03-23 VITALS — Ht 62.0 in | Wt 145.0 lb

## 2024-03-23 DIAGNOSIS — F331 Major depressive disorder, recurrent, moderate: Secondary | ICD-10-CM | POA: Diagnosis not present

## 2024-03-23 MED ORDER — BUPROPION HCL ER (XL) 300 MG PO TB24: 300.0000 mg | ORAL_TABLET | Freq: Every day | ORAL | 1 refills | Status: AC

## 2024-03-23 MED ORDER — BUSPIRONE HCL 5 MG PO TABS: 5.0000 mg | ORAL_TABLET | Freq: Two times a day (BID) | ORAL | 3 refills | Status: AC | PRN

## 2024-03-23 NOTE — Assessment & Plan Note (Signed)
 Improved but still feeling really anxious. Will watch for another couple of weeks but then add in buspar  if needed. Call with any concerns. Continue to monitor. Check in by mychart in 2 weeks and schedule from there.

## 2024-03-23 NOTE — Progress Notes (Signed)
 "  Ht 5' 2 (1.575 m)   Wt 145 lb (65.8 kg)   BMI 26.52 kg/m    Subjective:    Patient ID: Melody Pena, female    DOB: Oct 08, 1995, 29 y.o.   MRN: [id number]  HPI: Melody Pena is a 29 y.o. female  Chief Complaint  Patient presents with   Depression    Doing well. No trouble sleeping like last time. Overall mood better.    Anxiety    Still having anxiety. Would like to discuss possibly adding medication   DEPRESSION Mood status: better Satisfied with current treatment?: unsure- better, but not quiet there Symptom severity: mild  Duration of current treatment : months Side effects: no Medication compliance: excellent compliance Psychotherapy/counseling: no  Previous psychiatric medications: wellbutrin  Depressed mood: yes Anxious mood: yes Anhedonia: no Significant weight loss or gain: no Insomnia: no  Fatigue: yes Feelings of worthlessness or guilt: no Impaired concentration/indecisiveness: no Suicidal ideations: no Hopelessness: no Crying spells: no    03/23/2024    3:03 PM 02/13/2024    2:25 PM 01/17/2024    9:09 AM 03/16/2023    2:22 PM 05/20/2022    3:47 PM  Depression screen PHQ 2/9  Decreased Interest 1 1 3  0 1  Down, Depressed, Hopeless 0 1 2 0 1  PHQ - 2 Score 1 2 5  0 2  Altered sleeping 1 2 1  0 0  Tired, decreased energy 2 0 2 0 2  Change in appetite 3 3 3  0 1  Feeling bad or failure about yourself  1 1 2  0 1  Trouble concentrating 2 3 3  0 2  Moving slowly or fidgety/restless 0 0 0 0 0  Suicidal thoughts 0 0 0 0 0  PHQ-9 Score 10 11 16  0  8   Difficult doing work/chores Somewhat difficult Very difficult Very difficult Not difficult at all Somewhat difficult     Data saved with a previous flowsheet row definition      03/23/2024    3:06 PM 02/13/2024    2:29 PM 01/17/2024    9:10 AM 03/16/2023    2:23 PM  GAD 7 : Generalized Anxiety Score  Nervous, Anxious, on Edge 2 2 3  0  Control/stop worrying 2  3 0  Worry too much - different things 2 0 3  0  Trouble relaxing 0 0 3 0  Restless 2 2 2  0  Easily annoyed or irritable 0 1 3 0  Afraid - awful might happen 0 0 2 0  Total GAD 7 Score 8  19 0  Anxiety Difficulty Somewhat difficult Very difficult Very difficult Not difficult at all     Relevant past medical, surgical, family and social history reviewed and updated as indicated. Interim medical history since our last visit reviewed. Allergies and medications reviewed and updated.  Review of Systems  Constitutional: Negative.   Respiratory: Negative.    Cardiovascular: Negative.   Musculoskeletal: Negative.   Neurological: Negative.   Psychiatric/Behavioral:  Positive for dysphoric mood. Negative for agitation, behavioral problems, confusion, decreased concentration, hallucinations, self-injury, sleep disturbance and suicidal ideas. The patient is nervous/anxious. The patient is not hyperactive.     Per HPI unless specifically indicated above     Objective:    Ht 5' 2 (1.575 m)   Wt 145 lb (65.8 kg)   BMI 26.52 kg/m   Wt Readings from Last 3 Encounters:  03/23/24 145 lb (65.8 kg)  02/17/24 140 lb (63.5 kg)  02/13/24 145 lb (  65.8 kg)    Physical Exam Vitals and nursing note reviewed.  Constitutional:      General: She is not in acute distress.    Appearance: Normal appearance. She is not ill-appearing, toxic-appearing or diaphoretic.  HENT:     Head: Normocephalic and atraumatic.     Right Ear: External ear normal.     Left Ear: External ear normal.     Nose: Nose normal.     Mouth/Throat:     Mouth: Mucous membranes are moist.     Pharynx: Oropharynx is clear.  Eyes:     General: No scleral icterus.       Right eye: No discharge.        Left eye: No discharge.     Conjunctiva/sclera: Conjunctivae normal.     Pupils: Pupils are equal, round, and reactive to light.  Pulmonary:     Effort: Pulmonary effort is normal. No respiratory distress.     Comments: Speaking in full sentences Musculoskeletal:         General: Normal range of motion.     Cervical back: Normal range of motion.  Skin:    Coloration: Skin is not jaundiced or pale.     Findings: No bruising, erythema, lesion or rash.  Neurological:     Mental Status: She is alert and oriented to person, place, and time. Mental status is at baseline.  Psychiatric:        Mood and Affect: Mood normal.        Behavior: Behavior normal.        Thought Content: Thought content normal.        Judgment: Judgment normal.     Results for orders placed or performed during the hospital encounter of 02/17/24  POC Covid19/Flu A&B Antigen   Collection Time: 02/17/24 10:50 AM  Result Value Ref Range   Influenza A Antigen, POC Negative Negative   Influenza B Antigen, POC Negative Negative   Covid Antigen, POC Negative Negative  POCT rapid strep A   Collection Time: 02/17/24 11:16 AM  Result Value Ref Range   Rapid Strep A Screen Negative Negative      Assessment & Plan:   Problem List Items Addressed This Visit       Other   Moderate recurrent major depression (HCC) - Primary   Improved but still feeling really anxious. Will watch for another couple of weeks but then add in buspar  if needed. Call with any concerns. Continue to monitor. Check in by mychart in 2 weeks and schedule from there.       Relevant Medications   buPROPion  (WELLBUTRIN  XL) 300 MG 24 hr tablet   busPIRone  (BUSPAR ) 5 MG tablet     Follow up plan: Return check in by mychart in 2 weeks..   This visit was completed via video visit through MyChart due to the restrictions of the COVID-19 pandemic. All issues as above were discussed and addressed. Physical exam was done as above through visual confirmation on video through MyChart. If it was felt that the patient should be evaluated in the office, they were directed there. The patient verbally consented to this visit. Location of the patient: home Location of the provider: work Those involved with this call:  Provider:  Duwaine Louder, DO CMA: York Fogo, CMA, Front Desk/Registration: Claretta Maiden  Time spent on call: 15 minutes with patient face to face via video conference. More than 50% of this time was spent in counseling and coordination of  care. 23 minutes total spent in review of patient's record and preparation of their chart.     "

## 2024-04-09 ENCOUNTER — Other Ambulatory Visit: Payer: Self-pay | Admitting: Medical Genetics

## 2024-04-09 DIAGNOSIS — Z006 Encounter for examination for normal comparison and control in clinical research program: Secondary | ICD-10-CM
